# Patient Record
Sex: Male | Born: 1960 | Race: White | Hispanic: No | Marital: Married | Smoking: Never smoker
Health system: Southern US, Community
[De-identification: ages and names within clinical notes are randomized; demographics above are authoritative.]

## PROBLEM LIST (undated history)

## (undated) DIAGNOSIS — E079 Disorder of thyroid, unspecified: Secondary | ICD-10-CM

## (undated) DIAGNOSIS — F32A Depression, unspecified: Secondary | ICD-10-CM

## (undated) DIAGNOSIS — G939 Disorder of brain, unspecified: Secondary | ICD-10-CM

## (undated) DIAGNOSIS — I2510 Atherosclerotic heart disease of native coronary artery without angina pectoris: Secondary | ICD-10-CM

## (undated) DIAGNOSIS — F419 Anxiety disorder, unspecified: Secondary | ICD-10-CM

## (undated) DIAGNOSIS — K7460 Unspecified cirrhosis of liver: Secondary | ICD-10-CM

## (undated) DIAGNOSIS — F329 Major depressive disorder, single episode, unspecified: Secondary | ICD-10-CM

## (undated) DIAGNOSIS — I10 Essential (primary) hypertension: Secondary | ICD-10-CM

---

## 2009-06-29 ENCOUNTER — Emergency Department: Payer: Self-pay | Admitting: Internal Medicine

## 2014-03-09 ENCOUNTER — Ambulatory Visit: Payer: Self-pay

## 2014-12-24 ENCOUNTER — Ambulatory Visit
Admit: 2014-12-24 | Disposition: A | Discharge status: Home / Self Care | Payer: Self-pay | Attending: Family Medicine | Admitting: Family Medicine

## 2015-12-01 ENCOUNTER — Encounter: Payer: Self-pay | Admitting: Emergency Medicine

## 2015-12-01 ENCOUNTER — Emergency Department
Admission: EM | Admit: 2015-12-01 | Discharge: 2015-12-01 | Disposition: A | Discharge status: Home / Self Care | Payer: BLUE CROSS/BLUE SHIELD | Attending: Emergency Medicine | Admitting: Emergency Medicine

## 2015-12-01 DIAGNOSIS — F329 Major depressive disorder, single episode, unspecified: Secondary | ICD-10-CM | POA: Insufficient documentation

## 2015-12-01 DIAGNOSIS — I2510 Atherosclerotic heart disease of native coronary artery without angina pectoris: Secondary | ICD-10-CM | POA: Diagnosis not present

## 2015-12-01 DIAGNOSIS — K625 Hemorrhage of anus and rectum: Secondary | ICD-10-CM | POA: Diagnosis present

## 2015-12-01 DIAGNOSIS — L0231 Cutaneous abscess of buttock: Secondary | ICD-10-CM | POA: Insufficient documentation

## 2015-12-01 DIAGNOSIS — I10 Essential (primary) hypertension: Secondary | ICD-10-CM | POA: Diagnosis not present

## 2015-12-01 HISTORY — DX: Atherosclerotic heart disease of native coronary artery without angina pectoris: I25.10

## 2015-12-01 HISTORY — DX: Essential (primary) hypertension: I10

## 2015-12-01 HISTORY — DX: Major depressive disorder, single episode, unspecified: F32.9

## 2015-12-01 HISTORY — DX: Anxiety disorder, unspecified: F41.9

## 2015-12-01 HISTORY — DX: Depression, unspecified: F32.A

## 2015-12-01 MED ORDER — TRAMADOL HCL 50 MG PO TABS: 50.0000 mg | ORAL_TABLET | Freq: Four times a day (QID) | ORAL | Status: AC | PRN

## 2015-12-01 MED ORDER — SULFAMETHOXAZOLE-TRIMETHOPRIM 800-160 MG PO TABS: 1.0000 | ORAL_TABLET | Freq: Two times a day (BID) | ORAL | Status: DC

## 2015-12-01 NOTE — ED Provider Notes (Signed)
[facility] Emergency Department Provider Note  ____________________________________________    I have reviewed the triage vital signs and the nursing notes.   HISTORY  Chief Complaint Rectal Bleeding    HPI Gabriel Gray is a 55 y.o. male who presents with complaints of possible rectal bleeding and discharge since yesterday. Patient reports he fell onto his coccyx partially 1 week ago. He has had continued pain in his gluteal cleft. He noticed discharge yesterday and some blood. He denies lower show any weakness. No stool incontinence. No fevers or chills.     Past Medical History  Diagnosis Date  . Hypertension   . Depression   . Anxiety   . Coronary artery disease     There are no active problems to display for this patient.   History reviewed. No pertinent past surgical history.  Current Outpatient Rx  Name  Route  Sig  Dispense  Refill  . sulfamethoxazole-trimethoprim (BACTRIM DS,SEPTRA DS) 800-160 MG tablet   Oral   Take 1 tablet by mouth 2 (two) times daily.   14 tablet   0   . traMADol (ULTRAM) 50 MG tablet   Oral   Take 1 tablet (50 mg total) by mouth every 6 (six) hours as needed.   20 tablet   0     Allergies Review of patient's allergies indicates no known allergies.  History reviewed. No pertinent family history.  Social History Social History  Substance Use Topics  . Smoking status: Never Smoker   . Smokeless tobacco: None  . Alcohol Use: 1.2 - 1.8 oz/week    2-3 Glasses of wine per week     Comment: per night    Review of Systems  Constitutional: Negative for fever.  Gastrointestinal: Negative for abdominal pain  Genitourinary: Negative for dysuria. Musculoskeletal: Positive for coccyx pain Skin: Negative for rash. Neurological: Negative for focal weakness, negative for urinary or stool incontinence Psychiatric: no anxiety    ____________________________________________   PHYSICAL  EXAM:  VITAL SIGNS: ED Triage Vitals  Enc Vitals Group     BP 12/01/15 1337 169/87 mmHg     Pulse Rate 12/01/15 1337 100     Resp 12/01/15 1337 20     Temp 12/01/15 1337 98.6 F (37 C)     Temp Source 12/01/15 1337 Oral     SpO2 12/01/15 1337 97 %     Weight 12/01/15 1337 280 lb (127.007 kg)     Height 12/01/15 1337 6' (1.829 m)     Head Cir --      Peak Flow --      Pain Score 12/01/15 1355 6     Pain Loc --      Pain Edu? --      Excl. in [place]? --     Constitutional: Alert and oriented. Well appearing and in no distress.  Eyes: Conjunctivae are normal. No erythema or injection ENT   Head: Normocephalic and atraumatic.   Mouth/Throat: Mucous membranes are moist. Cardiovascular: Normal rate, regular rhythm. Normal and symmetric distal pulses are present in the upper extremities.  Respiratory: Normal respiratory effort without tachypnea nor retractions. Breath sounds are clear and equal bilaterally.  Gastrointestinal: Soft and non-tender in all quadrants. No distention. Patient with 4 x 2 cm gluteal cleft abscess which is draining pus and small amount of blood Genitourinary: deferred Musculoskeletal: Nontender with normal range of motion in all extremities. No lower extremity tenderness nor edema. Neurologic:  Normal speech and language. No  gross focal neurologic deficits are appreciated. Skin:  Skin is warm, dry and intact. No rash noted. Psychiatric: Mood and affect are normal. Patient exhibits appropriate insight and judgment.  ____________________________________________    LABS (pertinent positives/negatives)  Labs Reviewed - No data to display  ____________________________________________   EKG  None  ____________________________________________    RADIOLOGY  None  ____________________________________________   PROCEDURES  Procedure(s) performed: none  Critical Care performed: none  ____________________________________________   INITIAL  IMPRESSION / ASSESSMENT AND PLAN / ED COURSE  Pertinent labs & imaging results that were available during my care of the patient were reviewed by me and considered in my medical decision making (see chart for details).  Patient with draining gluteal cleft abscess, no fevers or chills. No evidence of surrounding cellulitis. We will start the patient on Bactrim and have him follow-up with his PCP for wound check in 2-3 days.  ____________________________________________   FINAL CLINICAL IMPRESSION(S) / ED DIAGNOSES  Final diagnoses:  Abscess, gluteal cleft          Lavonia Drafts, MD 12/01/15 1451

## 2015-12-01 NOTE — Discharge Instructions (Signed)

## 2015-12-01 NOTE — ED Notes (Signed)
MD Kinner at bedside  

## 2015-12-01 NOTE — ED Notes (Signed)
Pt verbalized understanding of discharge instructions. NAD at this time. 

## 2015-12-01 NOTE — ED Notes (Signed)
Pt presents to ED with c/o of rectal bleeding since yesterday. Pt said he fell 1 week ago on his tailbone and smelled something foul.

## 2017-08-30 DIAGNOSIS — F1021 Alcohol dependence, in remission: Secondary | ICD-10-CM | POA: Insufficient documentation

## 2017-09-17 DIAGNOSIS — E039 Hypothyroidism, unspecified: Secondary | ICD-10-CM | POA: Insufficient documentation

## 2017-09-18 DIAGNOSIS — K7030 Alcoholic cirrhosis of liver without ascites: Secondary | ICD-10-CM | POA: Insufficient documentation

## 2017-09-20 DIAGNOSIS — G119 Hereditary ataxia, unspecified: Secondary | ICD-10-CM | POA: Insufficient documentation

## 2017-12-31 ENCOUNTER — Encounter: Payer: Self-pay | Admitting: Emergency Medicine

## 2017-12-31 ENCOUNTER — Inpatient Hospital Stay
Admission: EM | Admit: 2017-12-31 | Discharge: 2018-01-05 | DRG: 641 | Disposition: A | Discharge status: Skilled Nursing Facility | Payer: BLUE CROSS/BLUE SHIELD | Attending: Internal Medicine | Admitting: Internal Medicine

## 2017-12-31 ENCOUNTER — Emergency Department: Payer: BLUE CROSS/BLUE SHIELD

## 2017-12-31 ENCOUNTER — Other Ambulatory Visit: Payer: Self-pay

## 2017-12-31 DIAGNOSIS — E875 Hyperkalemia: Secondary | ICD-10-CM | POA: Diagnosis present

## 2017-12-31 DIAGNOSIS — G119 Hereditary ataxia, unspecified: Secondary | ICD-10-CM | POA: Diagnosis present

## 2017-12-31 DIAGNOSIS — D696 Thrombocytopenia, unspecified: Secondary | ICD-10-CM | POA: Diagnosis present

## 2017-12-31 DIAGNOSIS — R531 Weakness: Secondary | ICD-10-CM | POA: Diagnosis present

## 2017-12-31 DIAGNOSIS — R188 Other ascites: Secondary | ICD-10-CM

## 2017-12-31 DIAGNOSIS — K766 Portal hypertension: Secondary | ICD-10-CM | POA: Diagnosis present

## 2017-12-31 DIAGNOSIS — Z79899 Other long term (current) drug therapy: Secondary | ICD-10-CM

## 2017-12-31 DIAGNOSIS — F329 Major depressive disorder, single episode, unspecified: Secondary | ICD-10-CM | POA: Diagnosis present

## 2017-12-31 DIAGNOSIS — K7290 Hepatic failure, unspecified without coma: Secondary | ICD-10-CM | POA: Diagnosis present

## 2017-12-31 DIAGNOSIS — F1020 Alcohol dependence, uncomplicated: Secondary | ICD-10-CM | POA: Diagnosis present

## 2017-12-31 DIAGNOSIS — Z79890 Hormone replacement therapy (postmenopausal): Secondary | ICD-10-CM | POA: Diagnosis not present

## 2017-12-31 DIAGNOSIS — Z8249 Family history of ischemic heart disease and other diseases of the circulatory system: Secondary | ICD-10-CM | POA: Diagnosis not present

## 2017-12-31 DIAGNOSIS — E860 Dehydration: Secondary | ICD-10-CM | POA: Diagnosis present

## 2017-12-31 DIAGNOSIS — R7989 Other specified abnormal findings of blood chemistry: Secondary | ICD-10-CM

## 2017-12-31 DIAGNOSIS — E039 Hypothyroidism, unspecified: Secondary | ICD-10-CM | POA: Diagnosis present

## 2017-12-31 DIAGNOSIS — I2510 Atherosclerotic heart disease of native coronary artery without angina pectoris: Secondary | ICD-10-CM | POA: Diagnosis present

## 2017-12-31 DIAGNOSIS — K7031 Alcoholic cirrhosis of liver with ascites: Secondary | ICD-10-CM | POA: Diagnosis not present

## 2017-12-31 DIAGNOSIS — R945 Abnormal results of liver function studies: Secondary | ICD-10-CM

## 2017-12-31 DIAGNOSIS — F419 Anxiety disorder, unspecified: Secondary | ICD-10-CM | POA: Diagnosis present

## 2017-12-31 DIAGNOSIS — Z7982 Long term (current) use of aspirin: Secondary | ICD-10-CM

## 2017-12-31 DIAGNOSIS — G312 Degeneration of nervous system due to alcohol: Secondary | ICD-10-CM | POA: Diagnosis present

## 2017-12-31 DIAGNOSIS — E878 Other disorders of electrolyte and fluid balance, not elsewhere classified: Secondary | ICD-10-CM | POA: Diagnosis present

## 2017-12-31 DIAGNOSIS — D72829 Elevated white blood cell count, unspecified: Secondary | ICD-10-CM | POA: Diagnosis present

## 2017-12-31 DIAGNOSIS — E871 Hypo-osmolality and hyponatremia: Principal | ICD-10-CM | POA: Diagnosis present

## 2017-12-31 DIAGNOSIS — I10 Essential (primary) hypertension: Secondary | ICD-10-CM | POA: Diagnosis present

## 2017-12-31 DIAGNOSIS — E8770 Fluid overload, unspecified: Secondary | ICD-10-CM | POA: Diagnosis present

## 2017-12-31 DIAGNOSIS — R000 Tachycardia, unspecified: Secondary | ICD-10-CM | POA: Diagnosis present

## 2017-12-31 LAB — COMPREHENSIVE METABOLIC PANEL
ALBUMIN: 3 g/dL — AB (ref 3.5–5.0)
ALK PHOS: 287 U/L — AB (ref 38–126)
ALT: 62 U/L (ref 17–63)
AST: 140 U/L — ABNORMAL HIGH (ref 15–41)
Anion gap: 11 (ref 5–15)
BILIRUBIN TOTAL: 4.7 mg/dL — AB (ref 0.3–1.2)
BUN: 7 mg/dL (ref 6–20)
CALCIUM: 8.5 mg/dL — AB (ref 8.9–10.3)
CO2: 25 mmol/L (ref 22–32)
Chloride: 79 mmol/L — ABNORMAL LOW (ref 101–111)
Creatinine, Ser: 0.74 mg/dL (ref 0.61–1.24)
GFR calc non Af Amer: >60 mL/min (ref >60)
GLUCOSE: 127 mg/dL — AB (ref 65–99)
POTASSIUM: 5.2 mmol/L — AB (ref 3.5–5.1)
SODIUM: 115 mmol/L — AB (ref 135–145)
TOTAL PROTEIN: 6.3 g/dL — AB (ref 6.5–8.1)

## 2017-12-31 LAB — URINALYSIS, COMPLETE (UACMP) WITH MICROSCOPIC
Bilirubin Urine: NEGATIVE
Glucose, UA: NEGATIVE mg/dL
HGB URINE DIPSTICK: NEGATIVE
Ketones, ur: NEGATIVE mg/dL
Leukocytes, UA: NEGATIVE
NITRITE: NEGATIVE
PH: 5 (ref 5.0–8.0)
Protein, ur: NEGATIVE mg/dL
Specific Gravity, Urine: 1.015 (ref 1.005–1.030)

## 2017-12-31 LAB — IRON AND TIBC: Iron: 96 ug/dL (ref 45–182)

## 2017-12-31 LAB — MAGNESIUM: MAGNESIUM: 1.5 mg/dL — AB (ref 1.7–2.4)

## 2017-12-31 LAB — TROPONIN I

## 2017-12-31 LAB — CBC
HEMATOCRIT: 41.7 % (ref 40.0–52.0)
Hemoglobin: 14.4 g/dL (ref 13.0–18.0)
MCH: 35 pg — ABNORMAL HIGH (ref 26.0–34.0)
MCHC: 34.6 g/dL (ref 32.0–36.0)
MCV: 101 fL — AB (ref 80.0–100.0)
PLATELETS: 364 10*3/uL (ref 150–440)
RBC: 4.13 MIL/uL — ABNORMAL LOW (ref 4.40–5.90)
RDW: 13.5 % (ref 11.5–14.5)
WBC: 21 10*3/uL — AB (ref 3.8–10.6)

## 2017-12-31 LAB — FOLATE: Folate: 6.4 ng/mL (ref >5.9)

## 2017-12-31 LAB — PROTIME-INR
INR: 1.2
Prothrombin Time: 15.1 seconds (ref 11.4–15.2)

## 2017-12-31 LAB — VITAMIN B12: Vitamin B-12: 1205 pg/mL — ABNORMAL HIGH (ref 180–914)

## 2017-12-31 LAB — AMMONIA: Ammonia: 17 umol/L (ref 9–35)

## 2017-12-31 LAB — FERRITIN: FERRITIN: 1359 ng/mL — AB (ref 24–336)

## 2017-12-31 LAB — LACTIC ACID, PLASMA: LACTIC ACID, VENOUS: 1.9 mmol/L (ref 0.5–1.9)

## 2017-12-31 LAB — ETHANOL: Alcohol, Ethyl (B): <10 mg/dL (ref <10)

## 2017-12-31 MED ORDER — BUPROPION HCL ER (XL) 300 MG PO TB24
300.0000 mg | ORAL_TABLET | Freq: Every day | ORAL | Status: DC
  Administered 2017-12-31 – 2018-01-05 (×5): 300 mg via ORAL
  Filled 2017-12-31 (×6): qty 1

## 2017-12-31 MED ORDER — ACETAMINOPHEN 325 MG PO TABS
650.0000 mg | ORAL_TABLET | Freq: Four times a day (QID) | ORAL | Status: DC | PRN
  Administered 2018-01-02 – 2018-01-04 (×3): 650 mg via ORAL
  Filled 2017-12-31 (×3): qty 2

## 2017-12-31 MED ORDER — BISACODYL 5 MG PO TBEC
5.0000 mg | DELAYED_RELEASE_TABLET | Freq: Every day | ORAL | Status: DC | PRN
  Administered 2018-01-03: 08:00:00 5 mg via ORAL
  Filled 2017-12-31: qty 1

## 2017-12-31 MED ORDER — SODIUM CHLORIDE 0.9 % IV BOLUS
1000.0000 mL | Freq: Once | INTRAVENOUS | Status: AC
  Administered 2017-12-31: 1000 mL via INTRAVENOUS

## 2017-12-31 MED ORDER — ASPIRIN EC 81 MG PO TBEC
81.0000 mg | DELAYED_RELEASE_TABLET | Freq: Every day | ORAL | Status: DC
  Administered 2017-12-31 – 2018-01-05 (×6): 81 mg via ORAL
  Filled 2017-12-31 (×6): qty 1

## 2017-12-31 MED ORDER — ONDANSETRON 4 MG PO TBDP
4.0000 mg | ORAL_TABLET | Freq: Once | ORAL | Status: AC
  Administered 2017-12-31: 4 mg via ORAL

## 2017-12-31 MED ORDER — MAGNESIUM SULFATE 2 GM/50ML IV SOLN
2.0000 g | Freq: Once | INTRAVENOUS | Status: AC
  Administered 2017-12-31: 2 g via INTRAVENOUS
  Filled 2017-12-31: qty 50

## 2017-12-31 MED ORDER — SENNOSIDES-DOCUSATE SODIUM 8.6-50 MG PO TABS
1.0000 | ORAL_TABLET | Freq: Every evening | ORAL | Status: DC | PRN
  Administered 2018-01-03: 1 via ORAL
  Filled 2017-12-31: qty 1

## 2017-12-31 MED ORDER — HYDROCODONE-ACETAMINOPHEN 5-325 MG PO TABS: 1.0000 | ORAL_TABLET | ORAL | Status: DC | PRN

## 2017-12-31 MED ORDER — LORAZEPAM 2 MG/ML IJ SOLN: 1.0000 mg | Freq: Four times a day (QID) | INTRAMUSCULAR | Status: AC | PRN

## 2017-12-31 MED ORDER — ALBUTEROL SULFATE (2.5 MG/3ML) 0.083% IN NEBU: 2.5000 mg | INHALATION_SOLUTION | RESPIRATORY_TRACT | Status: DC | PRN

## 2017-12-31 MED ORDER — ONDANSETRON HCL 4 MG/2ML IJ SOLN
4.0000 mg | Freq: Four times a day (QID) | INTRAMUSCULAR | Status: DC | PRN
  Administered 2017-12-31 – 2018-01-02 (×7): 4 mg via INTRAVENOUS
  Filled 2017-12-31 (×7): qty 2

## 2017-12-31 MED ORDER — FOLIC ACID 1 MG PO TABS
1.0000 mg | ORAL_TABLET | Freq: Every day | ORAL | Status: DC
  Administered 2017-12-31 – 2018-01-05 (×6): 1 mg via ORAL
  Filled 2017-12-31 (×6): qty 1

## 2017-12-31 MED ORDER — LEVOTHYROXINE SODIUM 50 MCG PO TABS
50.0000 ug | ORAL_TABLET | Freq: Every day | ORAL | Status: DC
Start: 2018-01-01 — End: 2018-01-05
  Administered 2018-01-01 – 2018-01-05 (×5): 50 ug via ORAL
  Filled 2017-12-31 (×5): qty 1

## 2017-12-31 MED ORDER — SODIUM CHLORIDE 0.9 % IV SOLN
INTRAVENOUS | Status: DC
  Administered 2017-12-31: 1000 mL via INTRAVENOUS
  Administered 2018-01-01 (×2): via INTRAVENOUS

## 2017-12-31 MED ORDER — FUROSEMIDE 10 MG/ML IJ SOLN
20.0000 mg | Freq: Once | INTRAMUSCULAR | Status: AC
  Administered 2017-12-31: 20 mg via INTRAVENOUS
  Filled 2017-12-31: qty 2

## 2017-12-31 MED ORDER — ACETAMINOPHEN 650 MG RE SUPP: 650.0000 mg | Freq: Four times a day (QID) | RECTAL | Status: DC | PRN

## 2017-12-31 MED ORDER — ONDANSETRON HCL 4 MG PO TABS: 4.0000 mg | ORAL_TABLET | Freq: Once | ORAL | Status: DC

## 2017-12-31 MED ORDER — TRAZODONE HCL 50 MG PO TABS
100.0000 mg | ORAL_TABLET | Freq: Every day | ORAL | Status: DC
  Administered 2018-01-02 – 2018-01-04 (×4): 100 mg via ORAL
  Filled 2017-12-31 (×6): qty 2

## 2017-12-31 MED ORDER — ADULT MULTIVITAMIN W/MINERALS CH
1.0000 | ORAL_TABLET | Freq: Every day | ORAL | Status: DC
  Administered 2018-01-01 – 2018-01-05 (×5): 1 via ORAL
  Filled 2017-12-31 (×5): qty 1

## 2017-12-31 MED ORDER — VITAMIN B-1 100 MG PO TABS
100.0000 mg | ORAL_TABLET | Freq: Every day | ORAL | Status: DC
  Administered 2018-01-01 – 2018-01-05 (×5): 100 mg via ORAL
  Filled 2017-12-31 (×6): qty 1

## 2017-12-31 MED ORDER — LORAZEPAM 1 MG PO TABS: 1.0000 mg | ORAL_TABLET | Freq: Four times a day (QID) | ORAL | Status: AC | PRN

## 2017-12-31 MED ORDER — ONDANSETRON 4 MG PO TBDP
ORAL_TABLET | ORAL | Status: AC
  Administered 2017-12-31: 4 mg via ORAL
  Filled 2017-12-31: qty 1

## 2017-12-31 MED ORDER — THIAMINE HCL 100 MG/ML IJ SOLN
100.0000 mg | Freq: Every day | INTRAMUSCULAR | Status: DC
  Filled 2017-12-31 (×6): qty 1

## 2017-12-31 MED ORDER — ONDANSETRON HCL 4 MG PO TABS
4.0000 mg | ORAL_TABLET | Freq: Four times a day (QID) | ORAL | Status: DC | PRN
  Administered 2018-01-05: 4 mg via ORAL
  Filled 2017-12-31 (×2): qty 1

## 2017-12-31 MED ORDER — HEPARIN SODIUM (PORCINE) 5000 UNIT/ML IJ SOLN
5000.0000 [IU] | Freq: Three times a day (TID) | INTRAMUSCULAR | Status: DC
  Administered 2017-12-31 (×2): 5000 [IU] via SUBCUTANEOUS
  Filled 2017-12-31 (×2): qty 1

## 2017-12-31 NOTE — ED Notes (Signed)
Dr. Chen at bedside.

## 2017-12-31 NOTE — Progress Notes (Signed)
This RN informed by EMS who brought patient in stated that the patient's living situation is less than ideal, involving item hoarding and animal hoarding.  Reported numerous trash bags in the garage filled with wine bottles.  Also reports of a lot of cats and feline waste in the home.  Patient found down in own feces.  Patient's wife had what sounds like a very flat affect.  EMS worried about patient (or anyone) living in that situation.  Will discuss with charge next steps for APS reporting.

## 2017-12-31 NOTE — ED Triage Notes (Signed)
Pt to ED via EMS from home with witnessed syncopal episodes at home. PT states has been going on x1year. PT unaware of LOC. PT appears pale, per EMS pt states he has " liver issues" but denies cirrhosis. MD at bedside . VSS

## 2017-12-31 NOTE — ED Notes (Signed)
Critical sodium 115 verbal in person Dr. Marjean Donna.

## 2017-12-31 NOTE — ED Notes (Signed)
Bile on patient socks, new socks given wife at bedside.

## 2017-12-31 NOTE — ED Notes (Signed)
Pt oxygen 100% on RA, pt displays no problems or increased WOB but placed on 2L [place] per request.

## 2017-12-31 NOTE — H&P (Addendum)
[place] [facility] [place] NAME: Gabriel Gray    MR#:  [id number]  DATE OF BIRTH:  [birth year]  DATE OF ADMISSION:  12/31/2017  PRIMARY CARE PHYSICIAN: Gennaro Africa, MD   REQUESTING/REFERRING PHYSICIAN: Dr. Owens Shark.  CHIEF COMPLAINT:   Chief Complaint  Patient presents with  . Near Syncope   Generalized weakness, fall and near syncope. HISTORY OF PRESENT ILLNESS:  Gabriel Gray  is a 57 y.o. male with a known history of CAD, hypertension, depression and anxiety.  The patient presented the ED with above chief complaints.  The patient is alert and awake.  He denies any syncope episode but stated that he feels dizzy, weak and almost passed out.  The patient denies any loss of consciousness.  He complains of diarrhea for about 1-2 weeks.  He denies any fever, chills or abdominal pain, or taking constipation medication.  He is found tachycardia and hyponatremia with sodium level at 115.  PAST MEDICAL HISTORY:   Past Medical History:  Diagnosis Date  . Anxiety   . Coronary artery disease   . Depression   . Hypertension     PAST SURGICAL HISTORY:  History reviewed. No pertinent surgical history.  SOCIAL HISTORY:   Social History   Tobacco Use  . Smoking status: Never Smoker  Substance Use Topics  . Alcohol use: Yes    Alcohol/week: 1.2 - 1.8 oz    Types: 2 - 3 Glasses of wine per week    Comment: per night    FAMILY HISTORY:   Family History  Problem Relation Age of Onset  . Hypertension Mother   . Hypertension Father   . Prostate cancer Father     DRUG ALLERGIES:  No Known Allergies  REVIEW OF SYSTEMS:   Review of Systems  Constitutional: Positive for malaise/fatigue. Negative for chills and fever.  HENT: Negative for sore throat.   Eyes: Negative for blurred vision and double vision.  Respiratory: Negative for cough, hemoptysis, shortness of breath, wheezing and stridor.   Cardiovascular: Negative for chest pain,  palpitations, orthopnea and leg swelling.  Gastrointestinal: Positive for diarrhea and nausea. Negative for abdominal pain, blood in stool, melena and vomiting.  Genitourinary: Negative for dysuria, flank pain and hematuria.  Musculoskeletal: Negative for back pain and joint pain.  Skin: Negative for rash.  Neurological: Positive for dizziness and weakness. Negative for sensory change, focal weakness, seizures, loss of consciousness and headaches.       Leg cramps.  Endo/Heme/Allergies: Negative for polydipsia.  Psychiatric/Behavioral: Negative for depression. The patient is nervous/anxious.     MEDICATIONS AT HOME:   Prior to Admission medications   Medication Sig Start Date End Date Taking? Authorizing Provider  aspirin (ECOTRIN LOW STRENGTH) 81 MG EC tablet Take 1 tablet by mouth daily. 12/21/08  Yes [provider]  B Complex Vitamins (VITAMIN B COMPLEX PO) Take 1 tablet by mouth daily. 08/30/17 08/30/18 Yes [provider]  buPROPion (WELLBUTRIN XL) 300 MG 24 hr tablet Take 1 tablet by mouth daily. 08/25/17  Yes [provider]  levothyroxine (SYNTHROID, LEVOTHROID) 50 MCG tablet Take 1 tablet by mouth daily. 08/30/17 08/30/18 Yes [provider]  ondansetron (ZOFRAN-ODT) 8 MG disintegrating tablet Take 1 tablet by mouth 3 (three) times daily as needed. 12/29/17  Yes [provider]  traZODone (DESYREL) 50 MG tablet Take 100 mg by mouth at bedtime. 12/28/17  Yes [provider]      VITAL SIGNS:  Blood pressure 117/90, pulse (!) 110, temperature 97.7 F (36.5 C), resp. rate 19, SpO2 99 %.  PHYSICAL EXAMINATION:  Physical Exam  GENERAL:  57 y.o.-year-old patient lying in the bed with no acute distress.  EYES: Pupils equal, round, reactive to light and accommodation. No scleral icterus. Extraocular muscles intact.  HEENT: Head atraumatic, normocephalic. Oropharynx and nasopharynx clear.  NECK:  Supple, no jugular venous  distention. No thyroid enlargement, no tenderness.  LUNGS: Normal breath sounds bilaterally, no wheezing, rales,rhonchi or crepitation. No use of accessory muscles of respiration.  CARDIOVASCULAR: S1, S2 normal. No murmurs, rubs, or gallops.  ABDOMEN: Soft, mild tenderness and distention.  No rigidity or rebound.  Bowel sounds present.  Unable to estimate organomegaly or mass due to distention with gas.  EXTREMITIES: No cyanosis, or clubbing.  Bilateral leg pitting edema. NEUROLOGIC: Cranial nerves II through XII are intact. Muscle strength 4/5 in all extremities. Sensation intact. Gait not checked.  PSYCHIATRIC: The patient is alert and oriented x 3.  SKIN: No obvious rash, lesion, or ulcer.   LABORATORY PANEL:   CBC Recent Labs  Lab 12/31/17 1127  WBC 21.0*  HGB 14.4  HCT 41.7  PLT 364   ------------------------------------------------------------------------------------------------------------------  Chemistries  Recent Labs  Lab 12/31/17 1127  NA 115*  K 5.2*  CL 79*  CO2 25  GLUCOSE 127*  BUN 7  CREATININE 0.74  CALCIUM 8.5*  AST 140*  ALT 62  ALKPHOS 287*  BILITOT 4.7*   ------------------------------------------------------------------------------------------------------------------  Cardiac Enzymes Recent Labs  Lab 12/31/17 1127  TROPONINI <0.03   ------------------------------------------------------------------------------------------------------------------  RADIOLOGY:  Dg Chest Portable 1 View  Result Date: 12/31/2017 CLINICAL DATA:  Dyspnea syncope EXAM: PORTABLE CHEST 1 VIEW COMPARISON:  01/03/2015 FINDINGS: Hypoventilation. Decreased lung volume with bibasilar atelectasis. Negative for heart failure or effusion IMPRESSION: Hypoventilation with bibasilar atelectasis. Electronically Signed   By: Franchot Gallo M.D.   On: 12/31/2017 13:13      IMPRESSION AND PLAN:   Severe hyponatremia. The patient will be admitted to medical floor. Fluid  restriction, start normal saline IV and follow-up sodium level.  Abnormal liver function test.  Follow-up LFT and abdominal ultrasound, GI consult.  Mild hyperkalemia.  IV normal saline in the follow-up BMP.  Leukocytosis.  Unclear etiology, chest x-ray is unremarkable, urinalysis is unremarkable.  Possible related to diarrhea.  Diarrhea.  Follow-up GI panel and C. difficile test.  IV fluids support.  Alcohol abuse.  Start CIWA protocol.  CAD.  Continue aspirin.  Generalized weakness.  PT evaluation when the patient is a stable.  All the records are reviewed and case discussed with ED provider. Management plans discussed with the patient, family and they are in agreement.  CODE STATUS: Full code.  TOTAL TIME TAKING CARE OF THIS PATIENT: 56 minutes.    Demetrios Loll M.D on 12/31/2017 at 3:16 PM  Between 7am to 6pm - Pager - [phone]  After 6pm go to [email or web address] - Proofreader  [employer] [facility]  Office  [phone]  CC: Primary care physician; Gennaro Africa, MD   Note: This dictation was prepared with Dragon dictation along with smaller phrase technology. Any transcriptional errors that result from this process are unin

## 2017-12-31 NOTE — ED Notes (Signed)
Report to Shannon, RN  

## 2017-12-31 NOTE — ED Notes (Signed)
Pt repositioned in bed, bandage on left knee changed.

## 2017-12-31 NOTE — ED Notes (Addendum)
Attempted to get patient to bedside toilet. Pt uncooperative with moving extremities, states that he is is too weak and cannot even lift up his arms. States he does not have to use restroom to give urine or stool sample. Requesting nausea medication that dissolves under tongue.

## 2017-12-31 NOTE — ED Provider Notes (Signed)
[facility] Emergency Department Provider Note    First MD Initiated Contact with Patient 12/31/17 1306     (approximate)  I have reviewed the triage vital signs and the nursing notes.  level V caveat: History physical exam limited secondary to altered mental status HISTORY  Chief Complaint Near Syncope    HPI Gabriel Gray is a 57 y.o. male with below list of chronic medical conditions presents to the emergency department following multiple in his syncopal episodes or EMS. Patient states that he has had repetitive syncopal episodes times one year without any clear known etiology. Patient is unaware of any loss of consciousness. However EMS states that while in route to the emergency department the patient briefly became unresponsive with rapid return to previous mentation. Patient however confused on arrival to the emergency department.patient states "liver issues however they don't know why".patient also admits to a history of chronic diarrhea without a known etiology   Past Medical History:  Diagnosis Date  . Anxiety   . Coronary artery disease   . Depression   . Hypertension     Patient Active Problem List   Diagnosis Date Noted  . Hyponatremia 12/31/2017    History reviewed. No pertinent surgical history.  Prior to Admission medications   Medication Sig Start Date End Date Taking? Authorizing Provider  ondansetron (ZOFRAN-ODT) 8 MG disintegrating tablet Take 1 tablet by mouth 3 (three) times daily as needed. 12/29/17  Yes [provider]  traZODone (DESYREL) 50 MG tablet Take 100 mg by mouth at bedtime. 12/28/17  Yes [provider]    Allergies no known drug allergies No family history on file.  Social History Social History   Tobacco Use  . Smoking status: Never Smoker  Substance Use Topics  . Alcohol use: Yes    Alcohol/week: 1.2 - 1.8 oz    Types: 2 - 3 Glasses of wine per week    Comment: per night  . Drug  use: No    Review of Systems Constitutional: No fever/chills Eyes: No visual changes. ENT: No sore throat. Cardiovascular: Denies chest pain. Respiratory: Denies shortness of breath. Gastrointestinal: No abdominal pain.  No nausea, no vomiting.  No diarrhea.  No constipation. Genitourinary: Negative for dysuria. Musculoskeletal: Negative for neck pain.  Negative for back pain. Integumentary: Negative for rash. Neurological: Negative for headaches, focal weakness or numbness.positive for confusion   ____________________________________________   PHYSICAL EXAM:  VITAL SIGNS: ED Triage Vitals  Enc Vitals Group     BP 12/31/17 1119 102/82     Pulse Rate 12/31/17 1119 72     Resp 12/31/17 1117 16     Temp 12/31/17 1120 97.7 F (36.5 C)     Temp Source 12/31/17 1117 Oral     SpO2 12/31/17 1117 99 %     Weight --      Height --      Head Circumference --      Peak Flow --      Pain Score 12/31/17 1118 0     Pain Loc --      Pain Edu? --      Excl. in [place]? --     Constitutional: Alert and oriented.however rambling nonsensical speech Eyes: Conjunctivae are normal. scleral icterus PERRL. EOMI. Head: Atraumatic. Mouth/Throat: Mucous membranes are moist.  Oropharynx non-erythematous. Neck: No stridor.   Cardiovascular: Normal rate, regular rhythm. Good peripheral circulation. Grossly normal heart sounds. Respiratory: Normal respiratory effort.  No retractions. Lungs  CTAB. Gastrointestinal: Soft and nontender. No distention.  Musculoskeletal: No lower extremity tenderness nor edema. No gross deformities of extremities. Neurologic:  rambling nonsensical speech, confusion, occasional slurring of speech  Skin:  Skin is warm, dry and intact. No rash noted.positive jaundice Psychiatric: Mood and affect are normal. Speech and behavior are normal. ____________________________________________   LABS (all labs ordered are listed, but only abnormal results are displayed)  Labs  Reviewed  CBC - Abnormal; Notable for the following components:      Result Value   WBC 21.0 (*)    RBC 4.13 (*)    MCV 101.0 (*)    MCH 35.0 (*)    All other components within normal limits  COMPREHENSIVE METABOLIC PANEL - Abnormal; Notable for the following components:   Sodium 115 (*)    Potassium 5.2 (*)    Chloride 79 (*)    Glucose, Bld 127 (*)    Calcium 8.5 (*)    Total Protein 6.3 (*)    Albumin 3.0 (*)    AST 140 (*)    Alkaline Phosphatase 287 (*)    Total Bilirubin 4.7 (*)    All other components within normal limits  GASTROINTESTINAL PANEL BY PCR, STOOL (REPLACES STOOL CULTURE)  AMMONIA  TROPONIN I  PROTIME-INR  LACTIC ACID, PLASMA  URINALYSIS, COMPLETE (UACMP) WITH MICROSCOPIC   ____________________________________________  EKG  ED ECG REPORT I, Leon N BROWN, the attending physician, personally viewed and interpreted this ECG.   Date: 12/31/2017  EKG Time: 11:18 AM  Rate: 106  Rhythm: sinus tachycardia  Axis: normal  Intervals:normal  ST&T Change: none  ____________________________________________  RADIOLOGY I, [place] N BROWN, personally viewed and evaluated these images (plain radiographs) as part of my medical decision making, as well as reviewing the written report by the radiologist.  ED MD interpretation:  no acute pulmonary findings per radiologist  Official radiology report(s): Dg Chest Portable 1 View  Result Date: 12/31/2017 CLINICAL DATA:  Dyspnea syncope EXAM: PORTABLE CHEST 1 VIEW COMPARISON:  01/03/2015 FINDINGS: Hypoventilation. Decreased lung volume with bibasilar atelectasis. Negative for heart failure or effusion IMPRESSION: Hypoventilation with bibasilar atelectasis. Electronically Signed   By: Franchot Gallo M.D.   On: 12/31/2017 13:13    ____________________________________________     .Critical Care Performed by: Gregor Hams, MD Authorized by: Gregor Hams, MD   Critical care provider statement:     Critical care time (minutes):  30   Critical care time was exclusive of:  Separately billable procedures and treating other patients and teaching time   Critical care was necessary to treat or prevent imminent or life-threatening deterioration of the following conditions:  CNS failure or compromise   Critical care was time spent personally by me on the following activities:  Development of treatment plan with patient or surrogate, discussions with consultants, evaluation of patient's response to treatment, examination of patient, obtaining history from patient or surrogate, ordering and performing treatments and interventions, ordering and review of laboratory studies, ordering and review of radiographic studies, pulse oximetry, re-evaluation of patient's condition and review of old charts   I assumed direction of critical care for this patient from another provider in my specialty: no       ____________________________________________   INITIAL IMPRESSION / ASSESSMENT AND PLAN / ED COURSE  As part of my medical decision making, I reviewed the following data within the electronic MEDICAL RECORD NUMBER  57 year old male presenting with above stated history of physical exam secondary to altered mental  status and syncopal episodes. Concern for possible intracranial abnormality including CVA. Also concern for possible metabolic abnormality including hyperammonemia and hyponatremiagiven history of "liver issues and chronic diarrhea. Laboratory data consistent with hyponatremia with a sodium of 1:15 and a such patient received 2 L IV normal saline while in the emergency department. Ammonia normal. Patient discussed with Dr. Bridgett Larsson for hospital admission for further evaluation and management of hyponatremia ____________________________________________  FINAL CLINICAL IMPRESSION(S) / ED DIAGNOSES  Final diagnoses:  Hyponatremia     MEDICATIONS GIVEN DURING THIS VISIT:  Medications  0.9 %  sodium  chloride infusion (1,000 mLs Intravenous New Bag/Given 12/31/17 1323)  ondansetron (ZOFRAN-ODT) disintegrating tablet 4 mg (4 mg Oral Given 12/31/17 1220)  sodium chloride 0.9 % bolus 1,000 mL (0 mLs Intravenous Stopped 12/31/17 1319)     ED Discharge Orders    None       Note:  This document was prepared using Dragon voice recognition software and may include unintentional dictation errors.    Gregor Hams, MD 12/31/17 1357

## 2017-12-31 NOTE — ED Notes (Signed)
ED Provider at bedside. 

## 2017-12-31 NOTE — Consult Note (Signed)
Gabriel Darby, MD [address]  [place]  [place], [place] [address]  Main: [phone]  Fax: [phone] Pager: [phone]   Consultation  Referring Provider:     No ref. provider found Primary Care Physician:  Gennaro Africa, MD Primary Gastroenterologist: None        Reason for Consultation:     Decompensated cirrhosis  Date of Admission:  12/31/2017 Date of Consultation:  12/31/2017         HPI:   Gabriel Gray is a 57 y.o.caucasian male with history of decompensated cirrhosis, secondary to alcohol use presents with severe hyponatremia and worsening of abdominal distention. He was found down at home and brought by EMS to the emergency department. According to physical therapy, patient has been having several episodes of syncope over  1 year. He tells me that he does not know if he had cirrhosis. He was told that he has liver damage from alcohol use. 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 He is found to have severe hyponatremia, serum sodium 115. He is diagnosed with cirrhosis based on CT in 09/2017 with also showed mild ascites. He has chronically abnormal LFTs since 2017. Patient reports that beginning alcohol heavily for 7-8 years, lately has been consuming alcohol during weekends. He lives at home with his wife. He also reports having nonbloody diarrhea. He denies hematemesis, melena, rectal bleeding, nausea, vomiting, fever, chills. He is unemployed.    NSAIDs: denies  Antiplts/Anticoagulants/Anti thrombotics: none  GI Procedures: none  Past Medical History:  Diagnosis Date  . Anxiety   . Coronary artery disease   . Depression   . Hypertension     History reviewed. No pertinent surgical history.  Prior to Admission medications   Medication Sig Start Date End Date Taking? Authorizing Provider  aspirin (ECOTRIN LOW STRENGTH) 81 MG EC  tablet Take 1 tablet by mouth daily. 12/21/08  Yes [provider]  B Complex Vitamins (VITAMIN B COMPLEX PO) Take 1 tablet by mouth daily. 08/30/17 08/30/18 Yes [provider]  buPROPion (WELLBUTRIN XL) 300 MG 24 hr tablet Take 1 tablet by mouth daily. 08/25/17  Yes [provider]  levothyroxine (SYNTHROID, LEVOTHROID) 50 MCG tablet Take 1 tablet by mouth daily. 08/30/17 08/30/18 Yes [provider]  ondansetron (ZOFRAN-ODT) 8 MG disintegrating tablet Take 1 tablet by mouth 3 (three) times daily as needed. 12/29/17  Yes [provider]  traZODone (DESYREL) 50 MG tablet Take 100 mg by mouth at bedtime. 12/28/17  Yes [provider]    Family History  Problem Relation Age of Onset  . Hypertension Mother   . Hypertension Father   . Prostate cancer Father      Social History   Tobacco Use  . Smoking status: Never Smoker  . Smokeless tobacco: Never Used  Substance Use Topics  . Alcohol use: Yes    Alcohol/week: 1.2 - 1.8 oz    Types: 2 - 3 Glasses of wine per week    Comment: per night  . Drug use: No    Allergies as of 12/31/2017  . (No Known Allergies)    Review of Systems:    All systems reviewed and negative except where noted in HPI.   Physical Exam:  Vital signs in last 24 hours: Temp:  [97.7 F (36.5 C)-99.6 F (37.6 C)] 99.6 F (37.6 C) (04/19  1741) Pulse Rate:  [72-111] 107 (04/19 1741) Resp:  [14-22] 20 (04/19 1741) BP: (102-126)/(74-90) 126/82 (04/19 1741) SpO2:  [97 %-100 %] 97 % (04/19 1741) Weight:  [218 lb 14.4 oz (99.3 kg)] 218 lb 14.4 oz (99.3 kg) (04/19 1741)   General:  cooperative in NAD, appears confused Head:  Normocephalic and atraumatic. Eyes:   No icterus.   Conjunctiva pink. PERRLA. Ears:  Normal auditory acuity. Neck:  Supple; no masses or thyroidomegaly Lungs: Respirations even and unlabored. Lungs clear to auscultation bilaterally.   No wheezes, crackles, or rhonchi.  Heart:  Regular rate  and rhythm;  Without murmur, clicks, rubs or gallops Abdomen:  Soft, diffusely distended, nontender. Normal bowel sounds. No appreciable masses or hepatomegaly.  No rebound or guarding.  Rectal:  Not performed. Msk:  Symmetrical without gross deformities.generalized weakness, significant muscle wasting in bilateral upper and lower extremities  Extremities:  2+ edema, cyanosis or clubbing. Neurologic:  Alert and oriented x3; Skin: livedo reticularis  LAB RESULTS: CBC Latest Ref Rng & Units 12/31/2017  WBC 3.8 - 10.6 K/uL 21.0(H)  Hemoglobin 13.0 - 18.0 g/dL 14.4  Hematocrit 40.0 - 52.0 % 41.7  Platelets 150 - 440 K/uL 364    BMET BMP Latest Ref Rng & Units 12/31/2017  Glucose 65 - 99 mg/dL 127(H)  BUN 6 - 20 mg/dL 7  Creatinine 0.61 - 1.24 mg/dL 0.74  Sodium 135 - 145 mmol/L 115(LL)  Potassium 3.5 - 5.1 mmol/L 5.2(H)  Chloride 101 - 111 mmol/L 79(L)  CO2 22 - 32 mmol/L 25  Calcium 8.9 - 10.3 mg/dL 8.5(L)    LFT Hepatic Function Latest Ref Rng & Units 12/31/2017  Total Protein 6.5 - 8.1 g/dL 6.3(L)  Albumin 3.5 - 5.0 g/dL 3.0(L)  AST 15 - 41 U/L 140(H)  ALT 17 - 63 U/L 62  Alk Phosphatase 38 - 126 U/L 287(H)  Total Bilirubin 0.3 - 1.2 mg/dL 4.7(H)     STUDIES: Dg Chest Portable 1 View  Result Date: 12/31/2017 CLINICAL DATA:  Dyspnea syncope EXAM: PORTABLE CHEST 1 VIEW COMPARISON:  01/03/2015 FINDINGS: Hypoventilation. Decreased lung volume with bibasilar atelectasis. Negative for heart failure or effusion IMPRESSION: Hypoventilation with bibasilar atelectasis. Electronically Signed   By: Franchot Gallo M.D.   On: 12/31/2017 13:13      Impression / Plan:   Gabriel Gray is a 57 y.o. Caucasian male with alcohol induced decompensated cirrhosis presents with altered mental status in the setting of severe hyponatremia, ascites   Hyponatremia: most likely hypervolemic hyponatremia with fluid retention and secondary to decompensated cirrhosis - Management per primary team    - recommend nephrology consult - receiving normal saline - recommend furosemide 20 mg IV 1 - Monitor serum sodium closely  Decompensated cirrhosis: secondary to alcohol use,child class C, MELD-Na 24 Does not have chronic hepatitis B or C Portal hypertension: manifested by thrombocytopenia, ascites Ascites/volume overload: recommend ultrasound guided therapeutic paracentesis and fluid analysis Diuretics and 2 g sodium diet after resolution of hyponatremia Recommend evaluation for TIPS as outpatient if patient does not tolerate diuretics secondary to hyponatremia and needing serial paracentesis Varices: unknown, recommend EGD as outpatient [place] screening: CT from 09/2017 did not reveal liver lesions, check serum AFP Abstinence from alcohol  Alcoholism: He may have developed Wernicke's encephalopathy and cerebellar ataxia Check thiamine levels, B12 and folate levels Continue to administer folate acid, thiamine and multivitamin daily Abstinence from alcohol   Thank you for involving me in the care of this patient.  LOS: 0 days   Sherri Sear, MD  12/31/2017, 8:03 PM   Note: This dictation was prepared with Dragon dictation along with smaller phrase technology. Any transcriptional errors that result from this process are unintentional.

## 2018-01-01 ENCOUNTER — Inpatient Hospital Stay: Payer: BLUE CROSS/BLUE SHIELD

## 2018-01-01 LAB — HEPATIC FUNCTION PANEL
ALK PHOS: 208 U/L — AB (ref 38–126)
ALT: 41 U/L (ref 17–63)
AST: 85 U/L — AB (ref 15–41)
Albumin: 2.2 g/dL — ABNORMAL LOW (ref 3.5–5.0)
BILIRUBIN TOTAL: 4.2 mg/dL — AB (ref 0.3–1.2)
Bilirubin, Direct: 1.9 mg/dL — ABNORMAL HIGH (ref 0.1–0.5)
Indirect Bilirubin: 2.3 mg/dL — ABNORMAL HIGH (ref 0.3–0.9)
Total Protein: 4.6 g/dL — ABNORMAL LOW (ref 6.5–8.1)

## 2018-01-01 LAB — CBC
HEMATOCRIT: 33.7 % — AB (ref 40.0–52.0)
HEMOGLOBIN: 11.8 g/dL — AB (ref 13.0–18.0)
MCH: 35.9 pg — ABNORMAL HIGH (ref 26.0–34.0)
MCHC: 34.9 g/dL (ref 32.0–36.0)
MCV: 102.8 fL — ABNORMAL HIGH (ref 80.0–100.0)
Platelets: 257 10*3/uL (ref 150–440)
RBC: 3.28 MIL/uL — AB (ref 4.40–5.90)
RDW: 13.3 % (ref 11.5–14.5)
WBC: 14.5 10*3/uL — AB (ref 3.8–10.6)

## 2018-01-01 LAB — BASIC METABOLIC PANEL
ANION GAP: 6 (ref 5–15)
BUN: 10 mg/dL (ref 6–20)
CALCIUM: 7.5 mg/dL — AB (ref 8.9–10.3)
CO2: 23 mmol/L (ref 22–32)
CREATININE: 0.64 mg/dL (ref 0.61–1.24)
Chloride: 87 mmol/L — ABNORMAL LOW (ref 101–111)
Glucose, Bld: 109 mg/dL — ABNORMAL HIGH (ref 65–99)
Potassium: 4.8 mmol/L (ref 3.5–5.1)
SODIUM: 116 mmol/L — AB (ref 135–145)

## 2018-01-01 LAB — SODIUM: SODIUM: 118 mmol/L — AB (ref 135–145)

## 2018-01-01 MED ORDER — ALBUMIN HUMAN 25 % IV SOLN
25.0000 g | Freq: Once | INTRAVENOUS | Status: AC
  Administered 2018-01-01: 25 g via INTRAVENOUS
  Filled 2018-01-01: qty 100

## 2018-01-01 MED ORDER — FUROSEMIDE 10 MG/ML IJ SOLN: 40.0000 mg | Freq: Once | INTRAMUSCULAR | Status: DC

## 2018-01-01 MED ORDER — BLISTEX MEDICATED EX OINT
TOPICAL_OINTMENT | CUTANEOUS | Status: DC | PRN
  Filled 2018-01-01: qty 6.3

## 2018-01-01 NOTE — Progress Notes (Signed)
Gabriel Darby, MD [address]  [place]  [place], [place] [address]  Main: [phone]  Fax: [phone] Pager: [phone]   Subjective: He underwent ultrasound abdomen today which revealed moderate volume ascites. He reports that he has been able to make some urine since he was started on IV fluids. Reports that his cramps are better. He denies abdominal pain, nausea, vomiting. He is tolerating diet well  Vital signs in last 24 hours: Vitals:   12/31/17 1741 12/31/17 2035 01/01/18 0631 01/01/18 1408  BP: 126/82 113/78 116/77 110/82  Pulse: (!) 107 (!) 106 94 92  Resp: 20  (!) 21 20  Temp: 99.6 F (37.6 C) (!) 97.3 F (36.3 C) 98 F (36.7 C) 97.8 F (36.6 C)  TempSrc: Oral Oral Oral Oral  SpO2: 97% 96% 98%   Weight: 218 lb 14.4 oz (99.3 kg)     Height: 6' (1.829 m)      Weight change:   Intake/Output Summary (Last 24 hours) at 01/01/2018 1721 Last data filed at 01/01/2018 0500 Gross per 24 hour  Intake 577.08 ml  Output 900 ml  Net -322.92 ml     Exam: Heart:: Regular rate and rhythm or S1S2 present Lungs: clear to auscultation Abdomen:soft, distended, dull to percussion, nontender   Lab Results: CBC Latest Ref Rng & Units 01/01/2018 12/31/2017  WBC 3.8 - 10.6 K/uL 14.5(H) 21.0(H)  Hemoglobin 13.0 - 18.0 g/dL 11.8(L) 14.4  Hematocrit 40.0 - 52.0 % 33.7(L) 41.7  Platelets 150 - 440 K/uL 257 364   BMP Latest Ref Rng & Units 01/01/2018 01/01/2018 12/31/2017  Glucose 65 - 99 mg/dL - 109(H) 127(H)  BUN 6 - 20 mg/dL - 10 7  Creatinine 0.61 - 1.24 mg/dL - 0.64 0.74  Sodium 135 - 145 mmol/L 118(LL) 116(LL) 115(LL)  Potassium 3.5 - 5.1 mmol/L - 4.8 5.2(H)  Chloride 101 - 111 mmol/L - 87(L) 79(L)  CO2 22 - 32 mmol/L - 23 25  Calcium 8.9 - 10.3 mg/dL - 7.5(L) 8.5(L)   Hepatic Function Latest Ref Rng & Units 01/01/2018 12/31/2017  Total Protein 6.5 - 8.1 g/dL 4.6(L) 6.3(L)  Albumin 3.5 - 5.0 g/dL 2.2(L) 3.0(L)  AST 15 - 41 U/L 85(H) 140(H)  ALT 17 - 63  U/L 41 62  Alk Phosphatase 38 - 126 U/L 208(H) 287(H)  Total Bilirubin 0.3 - 1.2 mg/dL 4.2(H) 4.7(H)  Bilirubin, Direct 0.1 - 0.5 mg/dL 1.9(H) -   Micro Results: No results found for this or any previous visit (from the past 240 hour(s)). Studies/Results: Dg Chest Portable 1 View  Result Date: 12/31/2017 CLINICAL DATA:  Dyspnea syncope EXAM: PORTABLE CHEST 1 VIEW COMPARISON:  01/03/2015 FINDINGS: Hypoventilation. Decreased lung volume with bibasilar atelectasis. Negative for heart failure or effusion IMPRESSION: Hypoventilation with bibasilar atelectasis. Electronically Signed   By: Franchot Gallo M.D.   On: 12/31/2017 13:13   US Abdomen Limited Ruq  Result Date: 01/01/2018 CLINICAL DATA:  Abnormal liver function tests. History of alcohol abuse and cirrhosis. EXAM: ULTRASOUND ABDOMEN LIMITED RIGHT UPPER QUADRANT COMPARISON:  None. FINDINGS: Gallbladder: No gallstones. Upper limits of normal gallbladder wall thickness. No sonographic Murphy sign noted by sonographer. Common bile duct: Diameter: 5 mm Liver: Diffusely increased parenchymal echogenicity. Nodular liver contour. No focal lesion identified. Portal vein is patent on color Doppler imaging with normal direction of blood flow towards the liver. Moderate volume abdominal ascites. IMPRESSION: 1. Cirrhosis and ascites. 2. No gallstones or biliary dilatation. Electronically Signed   By:  Logan Bores M.D.   On: 01/01/2018 09:12   Medications: I have reviewed the patient's current medications. Scheduled Meds: . aspirin EC  81 mg Oral Daily  . buPROPion  300 mg Oral Daily  . folic acid  1 mg Oral Daily  . levothyroxine  50 mcg Oral QAC breakfast  . multivitamin with minerals  1 tablet Oral Daily  . thiamine  100 mg Oral Daily   Or  . thiamine  100 mg Intravenous Daily  . traZODone  100 mg Oral QHS   Continuous Infusions: PRN Meds:.acetaminophen **OR** acetaminophen, albuterol, bisacodyl, HYDROcodone-acetaminophen, LORazepam **OR**  LORazepam, ondansetron **OR** ondansetron (ZOFRAN) IV, senna-docusate  Active Problems:   Hyponatremia  Gabriel Gray is a 57 y.o. Caucasian male with alcohol induced decompensated cirrhosis presents with altered mental status in the setting of severe hyponatremia  Plan: Hyponatremia: most likely hypervolemic hyponatremia with fluid retention and secondary to decompensated cirrhosis. Serum sodium is stable but not improving - IV fluids were discontinued  - recommend nephrology consult - Monitor serum sodium closely  Decompensated cirrhosis: secondary to alcohol use,child class C, MELD-Na 24 Does not have chronic hepatitis B or C Portal hypertension: manifested by thrombocytopenia, ascites Ascites/volume overload: recommend ultrasound guided therapeutic paracentesis and fluid analysis Diuretics and 2 g sodium diet after resolution of hyponatremia Recommend evaluation for TIPS as outpatient if patient does not tolerate diuretics secondary to hyponatremia and needing serial paracentesis Varices: unknown, recommend EGD as outpatient [place] screening: CT from 09/2017 did not reveal liver lesions, check serum AFP Abstinence from alcohol  Alcoholism: He may have developed Wernicke's encephalopathy and cerebellar ataxia He has elevated B12, normal folate levels, thiamine levels or pendingdetected Continue to administer folate acid, thiamine and multivitamin daily Abstinence from alcohol   Thank you for involving me in the care of this patient.     LOS: 1 day     01/01/2018, 5:21 PM

## 2018-01-01 NOTE — Progress Notes (Signed)
Refusing trazodone tonight

## 2018-01-01 NOTE — Progress Notes (Signed)
[place] [facility] [place] NAME: Gabriel Gray    MR#:  [id number]  DATE OF BIRTH:  February 15, 1961  SUBJECTIVE:  Patient seen and evaluated by me today  he is awake and alert and responds to all verbal commands No seizures No confusion No abdominal pain  REVIEW OF SYSTEMS:    ROS  CONSTITUTIONAL: No documented fever. Has fatigue, weakness. No weight gain, no weight loss.  EYES: No blurry or double vision.  ENT: No tinnitus. No postnasal drip. No redness of the oropharynx.  RESPIRATORY: No cough, no wheeze, no hemoptysis. No dyspnea.  CARDIOVASCULAR: No chest pain. No orthopnea. No palpitations. No syncope.  GASTROINTESTINAL: No nausea, no vomiting or diarrhea. No abdominal pain. No melena or hematochezia.  GENITOURINARY: No dysuria or hematuria.  ENDOCRINE: No polyuria or nocturia. No heat or cold intolerance.  HEMATOLOGY: No anemia. No bruising. No bleeding.  INTEGUMENTARY: No rashes. No lesions.  MUSCULOSKELETAL: No arthritis. No swelling. No gout.  NEUROLOGIC: No numbness, tingling, or ataxia. No seizure-type activity.  PSYCHIATRIC: No anxiety. No insomnia. No ADD.   DRUG ALLERGIES:  No Known Allergies  VITALS:  Blood pressure 116/77, pulse 94, temperature 98 F (36.7 C), temperature source Oral, resp. rate (!) 21, height 6' (1.829 m), weight 99.3 kg (218 lb 14.4 oz), SpO2 98 %.  PHYSICAL EXAMINATION:   Physical Exam  GENERAL:  57 y.o.-year-old patient lying in the bed with no acute distress.  EYES: Pupils equal, round, reactive to light and accommodation. No scleral icterus. Extraocular muscles intact.  HEENT: Head atraumatic, normocephalic. Oropharynx and nasopharynx clear.  NECK:  Supple, no jugular venous distention. No thyroid enlargement, no tenderness.  LUNGS: Normal breath sounds bilaterally, no wheezing, rales, rhonchi. No use of accessory muscles of respiration.  CARDIOVASCULAR: S1, S2 normal. No murmurs, rubs, or gallops.   ABDOMEN: Soft, nontender, distended. Bowel sounds present. No organomegaly or mass.  EXTREMITIES: No cyanosis, clubbing or edema b/l.    NEUROLOGIC: Cranial nerves II through XII are intact. No focal Motor or sensory deficits b/l.   PSYCHIATRIC: The patient is alert and oriented x 3.  SKIN: No obvious rash, lesion, or ulcer.   LABORATORY PANEL:   CBC Recent Labs  Lab 01/01/18 0502  WBC 14.5*  HGB 11.8*  HCT 33.7*  PLT 257   ------------------------------------------------------------------------------------------------------------------ Chemistries  Recent Labs  Lab 12/31/17 1123  01/01/18 0502  NA  --    < > 116*  K  --    < > 4.8  CL  --    < > 87*  CO2  --    < > 23  GLUCOSE  --    < > 109*  BUN  --    < > 10  CREATININE  --    < > 0.64  CALCIUM  --    < > 7.5*  MG 1.5*  --   --   AST  --    < > 85*  ALT  --    < > 41  ALKPHOS  --    < > 208*  BILITOT  --    < > 4.2*   < > = values in this interval not displayed.   ------------------------------------------------------------------------------------------------------------------  Cardiac Enzymes Recent Labs  Lab 12/31/17 1127  TROPONINI <0.03   ------------------------------------------------------------------------------------------------------------------  RADIOLOGY:  Dg Chest Portable 1 View  Result Date: 12/31/2017 CLINICAL DATA:  Dyspnea syncope EXAM: PORTABLE CHEST 1 VIEW COMPARISON:  01/03/2015 FINDINGS: Hypoventilation. Decreased  lung volume with bibasilar atelectasis. Negative for heart failure or effusion IMPRESSION: Hypoventilation with bibasilar atelectasis. Electronically Signed   By: Franchot Gallo M.D.   On: 12/31/2017 13:13   US Abdomen Limited Ruq  Result Date: 01/01/2018 CLINICAL DATA:  Abnormal liver function tests. History of alcohol abuse and cirrhosis. EXAM: ULTRASOUND ABDOMEN LIMITED RIGHT UPPER QUADRANT COMPARISON:  None. FINDINGS: Gallbladder: No gallstones. Upper limits of normal  gallbladder wall thickness. No sonographic Murphy sign noted by sonographer. Common bile duct: Diameter: 5 mm Liver: Diffusely increased parenchymal echogenicity. Nodular liver contour. No focal lesion identified. Portal vein is patent on color Doppler imaging with normal direction of blood flow towards the liver. Moderate volume abdominal ascites. IMPRESSION: 1. Cirrhosis and ascites. 2. No gallstones or biliary dilatation. Electronically Signed   By: Logan Bores M.D.   On: 01/01/2018 09:12     ASSESSMENT AND PLAN:   57 year old male patient with history of cirrhosis of liver, coronary artery disease, hypertension, alcohol abuse currently under hospitalist service for hyponatremia and abdominal distention.  1 decompensated liver disease.  Appreciate gastroenterology follow-up No history of chronic hepatitis B or C Has portal hypertension manifested by ascites and thrombocytopenia Ultrasound-guided paracentesis Diuretics will be started after resolution of hyponatremia EGD recommended as outpatient  2.  Hyponatremia Fluid restriction Discontinue IV fluids Nephrology consultation Follow-up sodium levels closely Appears hypervolemic hyponatremia with fluid retention  3.  Alcohol abuse Thiamine and folic acid supplements Check B12 level  4. leukocytosis improving  5.DVT prophylaxis Sequential compression device to lower extremities  DC subcu heparin in view of liver disease    All the records are reviewed and case discussed with Care Management/Social Worker. Management plans discussed with the patient, family and they are in agreement.  CODE STATUS: Full  DVT Prophylaxis: SCDs  TOTAL TIME TAKING CARE OF THIS PATIENT: 35 minutes.   POSSIBLE D/C IN 2 to 3 DAYS, DEPENDING ON CLINICAL CONDITION.  Saundra Shelling M.D on 01/01/2018 at 1:30 PM  Between 7am to 6pm - Pager - [phone]  After 6pm go to [email or web address] - password [email or web address] [facility]  Office   [phone]  CC: Primary care physician; Gennaro Africa, MD  Note: This dictation was prepared with Dragon dictation along with smaller phrase technology. Any transcriptional errors that result from this process are unintentional.

## 2018-01-02 LAB — CBC
HCT: 37 % — ABNORMAL LOW (ref 40.0–52.0)
Hemoglobin: 13 g/dL (ref 13.0–18.0)
MCH: 36.3 pg — AB (ref 26.0–34.0)
MCHC: 35.1 g/dL (ref 32.0–36.0)
MCV: 103.4 fL — ABNORMAL HIGH (ref 80.0–100.0)
Platelets: 264 10*3/uL (ref 150–440)
RBC: 3.57 MIL/uL — ABNORMAL LOW (ref 4.40–5.90)
RDW: 13.5 % (ref 11.5–14.5)
WBC: 10.9 10*3/uL — ABNORMAL HIGH (ref 3.8–10.6)

## 2018-01-02 LAB — BASIC METABOLIC PANEL
Anion gap: 7 (ref 5–15)
BUN: 7 mg/dL (ref 6–20)
CALCIUM: 8.1 mg/dL — AB (ref 8.9–10.3)
CHLORIDE: 90 mmol/L — AB (ref 101–111)
CO2: 25 mmol/L (ref 22–32)
CREATININE: 0.58 mg/dL — AB (ref 0.61–1.24)
GFR calc non Af Amer: >60 mL/min (ref >60)
Glucose, Bld: 117 mg/dL — ABNORMAL HIGH (ref 65–99)
Potassium: 4.3 mmol/L (ref 3.5–5.1)
Sodium: 122 mmol/L — ABNORMAL LOW (ref 135–145)

## 2018-01-02 MED ORDER — RIFAXIMIN 550 MG PO TABS
550.0000 mg | ORAL_TABLET | Freq: Two times a day (BID) | ORAL | Status: DC
  Administered 2018-01-02 – 2018-01-05 (×6): 550 mg via ORAL
  Filled 2018-01-02 (×6): qty 1

## 2018-01-02 NOTE — Progress Notes (Signed)
[facility] NAME: Gabriel Gray    MR#:  [id number]  DATE OF BIRTH:  [birth year]  SUBJECTIVE:  Patient seen and evaluated by me today  he is awake and alert and responds to all verbal commands No seizures No confusion  abdominal pain better  REVIEW OF SYSTEMS:    ROS  CONSTITUTIONAL: No documented fever. Has fatigue, weakness. No weight gain, no weight loss.  EYES: No blurry or double vision.  ENT: No tinnitus. No postnasal drip. No redness of the oropharynx.  RESPIRATORY: No cough, no wheeze, no hemoptysis. No dyspnea.  CARDIOVASCULAR: No chest pain. No orthopnea. No palpitations. No syncope.  GASTROINTESTINAL: No nausea, no vomiting or diarrhea. No abdominal pain. No melena or hematochezia.  GENITOURINARY: No dysuria or hematuria.  ENDOCRINE: No polyuria or nocturia. No heat or cold intolerance.  HEMATOLOGY: No anemia. No bruising. No bleeding.  INTEGUMENTARY: No rashes. No lesions.  MUSCULOSKELETAL: No arthritis. No swelling. No gout.  NEUROLOGIC: No numbness, tingling, or ataxia. No seizure-type activity.  PSYCHIATRIC: No anxiety. No insomnia. No ADD.   DRUG ALLERGIES:  No Known Allergies  VITALS:  Blood pressure 114/82, pulse 99, temperature (!) 97.4 F (36.3 C), temperature source Oral, resp. rate 19, height 6' (1.829 m), weight 99.3 kg (218 lb 14.4 oz), SpO2 97 %.  PHYSICAL EXAMINATION:   Physical Exam  GENERAL:  57 y.o.-year-old patient lying in the bed with no acute distress.  EYES: Pupils equal, round, reactive to light and accommodation. No scleral icterus. Extraocular muscles intact.  HEENT: Head atraumatic, normocephalic. Oropharynx and nasopharynx clear.  NECK:  Supple, no jugular venous distention. No thyroid enlargement, no tenderness.  LUNGS: Normal breath sounds bilaterally, no wheezing, rales, rhonchi. No use of accessory muscles of respiration.  CARDIOVASCULAR: S1, S2 normal. No murmurs, rubs, or  gallops.  ABDOMEN: Soft, nontender, distended. Bowel sounds present. No organomegaly or mass.  EXTREMITIES: No cyanosis, clubbing or edema b/l.    NEUROLOGIC: Cranial nerves II through XII are intact. No focal Motor or sensory deficits b/l.   PSYCHIATRIC: The patient is alert and oriented x 3.  SKIN: No obvious rash, lesion, or ulcer.   LABORATORY PANEL:   CBC Recent Labs  Lab 01/02/18 0455  WBC 10.9*  HGB 13.0  HCT 37.0*  PLT 264   ------------------------------------------------------------------------------------------------------------------ Chemistries  Recent Labs  Lab 12/31/17 1123  01/01/18 0502  01/02/18 0455  NA  --    < > 116*   < > 122*  K  --    < > 4.8  --  4.3  CL  --    < > 87*  --  90*  CO2  --    < > 23  --  25  GLUCOSE  --    < > 109*  --  117*  BUN  --    < > 10  --  7  CREATININE  --    < > 0.64  --  0.58*  CALCIUM  --    < > 7.5*  --  8.1*  MG 1.5*  --   --   --   --   AST  --    < > 85*  --   --   ALT  --    < > 41  --   --   ALKPHOS  --    < > 208*  --   --   BILITOT  --    < >  4.2*  --   --    < > = values in this interval not displayed.   ------------------------------------------------------------------------------------------------------------------  Cardiac Enzymes Recent Labs  Lab 12/31/17 1127  TROPONINI <0.03   ------------------------------------------------------------------------------------------------------------------  RADIOLOGY:  Dg Chest Portable 1 View  Result Date: 12/31/2017 CLINICAL DATA:  Dyspnea syncope EXAM: PORTABLE CHEST 1 VIEW COMPARISON:  01/03/2015 FINDINGS: Hypoventilation. Decreased lung volume with bibasilar atelectasis. Negative for heart failure or effusion IMPRESSION: Hypoventilation with bibasilar atelectasis. Electronically Signed   By: Franchot Gallo M.D.   On: 12/31/2017 13:13   US Abdomen Limited Ruq  Result Date: 01/01/2018 CLINICAL DATA:  Abnormal liver function tests. History of alcohol abuse  and cirrhosis. EXAM: ULTRASOUND ABDOMEN LIMITED RIGHT UPPER QUADRANT COMPARISON:  None. FINDINGS: Gallbladder: No gallstones. Upper limits of normal gallbladder wall thickness. No sonographic Murphy sign noted by sonographer. Common bile duct: Diameter: 5 mm Liver: Diffusely increased parenchymal echogenicity. Nodular liver contour. No focal lesion identified. Portal vein is patent on color Doppler imaging with normal direction of blood flow towards the liver. Moderate volume abdominal ascites. IMPRESSION: 1. Cirrhosis and ascites. 2. No gallstones or biliary dilatation. Electronically Signed   By: Logan Bores M.D.   On: 01/01/2018 09:12     ASSESSMENT AND PLAN:   57 year old male patient with history of cirrhosis of liver, coronary artery disease, hypertension, alcohol abuse currently under hospitalist service for hyponatremia and abdominal distention.  1 decompensated liver disease.  Appreciate gastroenterology follow-up No history of chronic hepatitis B or C Has portal hypertension manifested by ascites and thrombocytopenia Ultrasound-guided paracentesis by IR tmrw Diuretics will be started after resolution of hyponatremia EGD recommended as outpatient  2.  Hyponatremia improving Fluid restriction Discontinue IV fluids Nephrology consultation Follow-up sodium levels closely Appears hypervolemic hyponatremia with fluid retention  3.  Alcohol abuse Thiamine and folic acid supplements  4. leukocytosis improved  5.DVT prophylaxis Sequential compression device to lower extremities  DC subcu heparin in view of liver disease    All the records are reviewed and case discussed with Care Management/Social Worker. Management plans discussed with the patient, family and they are in agreement.  CODE STATUS: Full  DVT Prophylaxis: SCDs  TOTAL TIME TAKING CARE OF THIS PATIENT: 35 minutes.   POSSIBLE D/C IN 2 to 3 DAYS, DEPENDING ON CLINICAL CONDITION.  Saundra Shelling M.D on 01/02/2018  at 11:50 AM  Between 7am to 6pm - Pager - [phone]  After 6pm go to [email or web address] - password [email or web address] [facility]  Office  [phone]  CC: Primary care physician; Gennaro Africa, MD  Note: This dictation was prepared with Dragon dictation along with smaller phrase technology. Any transcriptional errors that result from this process are unintentional.

## 2018-01-02 NOTE — Progress Notes (Signed)
Gabriel Darby, MD [address]  [place]  [place], [place] [address]  Main: [phone]  Fax: [phone] Pager: [phone]   Subjective: His serum sodium levels are improving. Tolerating by mouth diet well. He did not have a bowel movement but he is worried about having diarrhea and requesting Imodium  Vital signs in last 24 hours: Vitals:   01/01/18 1408 01/01/18 1956 01/02/18 0519 01/02/18 1407  BP: 110/82 110/75 114/82 100/68  Pulse: 92 89 99 96  Resp: 20 18 19 18   Temp: 97.8 F (36.6 C) 97.8 F (36.6 C) (!) 97.4 F (36.3 C) (!) 97.1 F (36.2 C)  TempSrc: Oral Oral Oral Oral  SpO2:  100% 97% 96%  Weight:      Height:       Weight change:   Intake/Output Summary (Last 24 hours) at 01/02/2018 1919 Last data filed at 01/02/2018 1302 Gross per 24 hour  Intake 480 ml  Output 1700 ml  Net -1220 ml     Exam: Heart:: Regular rate and rhythm or S1S2 present Lungs: clear to auscultation Abdomen:soft, distended, dull to percussion, nontender   Lab Results: CBC Latest Ref Rng & Units 01/02/2018 01/01/2018 12/31/2017  WBC 3.8 - 10.6 K/uL 10.9(H) 14.5(H) 21.0(H)  Hemoglobin 13.0 - 18.0 g/dL 13.0 11.8(L) 14.4  Hematocrit 40.0 - 52.0 % 37.0(L) 33.7(L) 41.7  Platelets 150 - 440 K/uL 264 257 364   BMP Latest Ref Rng & Units 01/02/2018 01/01/2018 01/01/2018  Glucose 65 - 99 mg/dL 117(H) - 109(H)  BUN 6 - 20 mg/dL 7 - 10  Creatinine 0.61 - 1.24 mg/dL 0.58(L) - 0.64  Sodium 135 - 145 mmol/L 122(L) 118(LL) 116(LL)  Potassium 3.5 - 5.1 mmol/L 4.3 - 4.8  Chloride 101 - 111 mmol/L 90(L) - 87(L)  CO2 22 - 32 mmol/L 25 - 23  Calcium 8.9 - 10.3 mg/dL 8.1(L) - 7.5(L)   Hepatic Function Latest Ref Rng & Units 01/01/2018 12/31/2017  Total Protein 6.5 - 8.1 g/dL 4.6(L) 6.3(L)  Albumin 3.5 - 5.0 g/dL 2.2(L) 3.0(L)  AST 15 - 41 U/L 85(H) 140(H)  ALT 17 - 63 U/L 41 62  Alk Phosphatase 38 - 126 U/L 208(H) 287(H)  Total Bilirubin 0.3 - 1.2 mg/dL 4.2(H) 4.7(H)  Bilirubin,  Direct 0.1 - 0.5 mg/dL 1.9(H) -   Micro Results: No results found for this or any previous visit (from the past 240 hour(s)). Studies/Results: US Abdomen Limited Ruq  Result Date: 01/01/2018 CLINICAL DATA:  Abnormal liver function tests. History of alcohol abuse and cirrhosis. EXAM: ULTRASOUND ABDOMEN LIMITED RIGHT UPPER QUADRANT COMPARISON:  None. FINDINGS: Gallbladder: No gallstones. Upper limits of normal gallbladder wall thickness. No sonographic Murphy sign noted by sonographer. Common bile duct: Diameter: 5 mm Liver: Diffusely increased parenchymal echogenicity. Nodular liver contour. No focal lesion identified. Portal vein is patent on color Doppler imaging with normal direction of blood flow towards the liver. Moderate volume abdominal ascites. IMPRESSION: 1. Cirrhosis and ascites. 2. No gallstones or biliary dilatation. Electronically Signed   By: Logan Bores M.D.   On: 01/01/2018 09:12   Medications: I have reviewed the patient's current medications. Scheduled Meds: . aspirin EC  81 mg Oral Daily  . buPROPion  300 mg Oral Daily  . folic acid  1 mg Oral Daily  . levothyroxine  50 mcg Oral QAC breakfast  . multivitamin with minerals  1 tablet Oral Daily  . thiamine  100 mg Oral Daily   Or  .  thiamine  100 mg Intravenous Daily  . traZODone  100 mg Oral QHS   Continuous Infusions: PRN Meds:.acetaminophen **OR** acetaminophen, albuterol, bisacodyl, HYDROcodone-acetaminophen, lip balm, LORazepam **OR** LORazepam, ondansetron **OR** ondansetron (ZOFRAN) IV, senna-docusate  Active Problems:   Hyponatremia  Gabriel Gray is a 57 y.o. Caucasian male with alcohol induced decompensated cirrhosis presents with altered mental status in the setting of severe hyponatremia  Plan: Hyponatremia: most likely hypervolemic hyponatremia with fluid retention and secondary to decompensated cirrhosis. Serum sodium is improving - nephrology consult is pending - Monitor serum sodium  closely  Decompensated cirrhosis: secondary to alcohol use,child class C, MELD-Na 24 Does not have chronic hepatitis B or C Portal hypertension: manifested by thrombocytopenia, ascites Ascites/volume overload: recommend ultrasound guided therapeutic paracentesis and fluid analysis Diuretics and 2 g sodium diet after resolution of hyponatremia Recommend evaluation for TIPS as outpatient if patient does not tolerate diuretics secondary to hyponatremia and needs serial paracentesis Varices: unknown, recommend EGD as outpatient [place] screening: CT from 09/2017 did not reveal liver lesions, serum AFP pending Hepatic encephalopathy: start rifaximin Abstinence from alcohol  Alcoholism: He may have developed Wernicke's encephalopathy and cerebellar ataxia He has elevated B12, normal folate levels, thiamine levels are pending  Continue to administer folate acid, thiamine and multivitamin daily Abstinence from alcohol   Thank you for involving me in the care of this patient. Dr. Vicente Males will cover from tomorrow     LOS: 2 days   Gabriel Gray 01/02/2018, 7:19 PM

## 2018-01-03 ENCOUNTER — Inpatient Hospital Stay: Payer: BLUE CROSS/BLUE SHIELD

## 2018-01-03 LAB — BODY FLUID CELL COUNT WITH DIFFERENTIAL
Eos, Fluid: 0 %
LYMPHS FL: 25 %
MONOCYTE-MACROPHAGE-SEROUS FLUID: 69 %
NEUTROPHIL FLUID: 6 %
Other Cells, Fluid: 0 %
Total Nucleated Cell Count, Fluid: 72 cu mm

## 2018-01-03 LAB — ALBUMIN, PLEURAL OR PERITONEAL FLUID

## 2018-01-03 LAB — BASIC METABOLIC PANEL
ANION GAP: 4 — AB (ref 5–15)
BUN: 6 mg/dL (ref 6–20)
CALCIUM: 7.8 mg/dL — AB (ref 8.9–10.3)
CO2: 26 mmol/L (ref 22–32)
Chloride: 94 mmol/L — ABNORMAL LOW (ref 101–111)
Creatinine, Ser: 0.55 mg/dL — ABNORMAL LOW (ref 0.61–1.24)
Glucose, Bld: 116 mg/dL — ABNORMAL HIGH (ref 65–99)
Potassium: 4.7 mmol/L (ref 3.5–5.1)
Sodium: 124 mmol/L — ABNORMAL LOW (ref 135–145)

## 2018-01-03 LAB — HIV ANTIBODY (ROUTINE TESTING W REFLEX): HIV Screen 4th Generation wRfx: NONREACTIVE

## 2018-01-03 LAB — PROTEIN, PLEURAL OR PERITONEAL FLUID: Total protein, fluid: <3 g/dL

## 2018-01-03 LAB — AFP TUMOR MARKER: AFP, Serum, Tumor Marker: 4.9 ng/mL (ref 0.0–8.3)

## 2018-01-03 MED ORDER — ALBUMIN HUMAN 25 % IV SOLN
25.0000 g | Freq: Once | INTRAVENOUS | Status: AC
  Administered 2018-01-03: 25 g via INTRAVENOUS
  Filled 2018-01-03: qty 100

## 2018-01-03 NOTE — Clinical Social Work Placement (Signed)
   CLINICAL SOCIAL WORK PLACEMENT  NOTE  Date:  01/03/2018  Patient Details  Name: Armari Fussell MRN: [id number] Date of Birth: 1961-09-07  Clinical Social Work is seeking post-discharge placement for this patient at the [place] level of care (*CSW will initial, date and re-position this form in  chart as items are completed):  Yes   Patient/family provided with [facility] Work Department's list of facilities offering this level of care within the geographic area requested by the patient (or if unable, by the patient's family).  Yes   Patient/family informed of their freedom to choose among providers that offer the needed level of care, that participate in Medicare, Medicaid or managed care program needed by the patient, have an available bed and are willing to accept the patient.  Yes   Patient/family informed of [facility]'s ownership interest in [facility], as well as of the fact that they are under no obligation to receive care at these facilities.  PASRR submitted to EDS on       PASRR number received on       Existing PASRR number confirmed on 01/03/18     FL2 transmitted to all facilities in geographic area requested by pt/family on 01/03/18     FL2 transmitted to all facilities within larger geographic area on       Patient informed that his/her managed care company has contracts with or will negotiate with certain facilities, including the following:        Yes   Patient/family informed of bed offers received.  Patient chooses bed at [facility] )     Physician recommends and patient chooses bed at      Patient to be transferred to   on  .  Patient to be transferred to facility by       Patient family notified on   of transfer.  Name of family member notified:        PHYSICIAN       Additional Comment:    _______________________________________________ Moosa Bueche, [name],  LCSW 01/03/2018, 6:46 PM

## 2018-01-03 NOTE — NC FL2 (Signed)
[place] LEVEL OF CARE SCREENING TOOL     IDENTIFICATION  Patient Name: Gabriel Gray Birthdate: 1961/01/21 Sex: male Admission Date (Current Location): 12/31/2017  [place] and [place] Number:  Engineering geologist and Address:  [facility], [address], [place], [place] [address]      Provider Number: [id number]  Attending Physician Name and Address:  Saundra Shelling, MD  Relative Name and Phone Number:       Current Level of Care: Hospital Recommended Level of Care: [facility] Prior Approval Number:    Date Approved/Denied:   PASRR Number: [id number] )  Discharge Plan: SNF    Current Diagnoses: Patient Active Problem List   Diagnosis Date Noted  . Hyponatremia 12/31/2017    Orientation RESPIRATION BLADDER Height & Weight     Self, Time, Situation, Place  Normal Continent Weight: 218 lb 14.4 oz (99.3 kg) Height:  6' (182.9 cm)  BEHAVIORAL SYMPTOMS/MOOD NEUROLOGICAL BOWEL NUTRITION STATUS      Continent Diet(Diet: Regular )  AMBULATORY STATUS COMMUNICATION OF NEEDS Skin   Extensive Assist Verbally Normal                       Personal Care Assistance Level of Assistance  Bathing, Feeding, Dressing Bathing Assistance: Limited assistance Feeding assistance: Independent Dressing Assistance: Limited assistance     Functional Limitations Info  Sight, Hearing, Speech Sight Info: Adequate Hearing Info: Adequate Speech Info: Adequate    SPECIAL CARE FACTORS FREQUENCY  PT (By licensed PT), OT (By licensed OT)     PT Frequency: (5) OT Frequency: (5)            Contractures      Additional Factors Info  Code Status, Allergies Code Status Info: (Full Code. ) Allergies Info: (No Known Allergies. )           Current Medications (01/03/2018):  This is the current hospital active medication list Current Facility-Administered Medications  Medication Dose Route Frequency Provider Last  Rate Last Dose  . acetaminophen (TYLENOL) tablet 650 mg  650 mg Oral Q6H PRN Demetrios Loll, MD   650 mg at 01/03/18 1129   Or  . acetaminophen (TYLENOL) suppository 650 mg  650 mg Rectal Q6H PRN Demetrios Loll, MD      . albuterol (PROVENTIL) (2.5 MG/3ML) 0.083% nebulizer solution 2.5 mg  2.5 mg Nebulization Q2H PRN Demetrios Loll, MD      . aspirin EC tablet 81 mg  81 mg Oral Daily Demetrios Loll, MD   81 mg at 01/03/18 3382  . bisacodyl (DULCOLAX) EC tablet 5 mg  5 mg Oral Daily PRN Demetrios Loll, MD   5 mg at 01/03/18 0819  . buPROPion (WELLBUTRIN XL) 24 hr tablet 300 mg  300 mg Oral Daily Demetrios Loll, MD   300 mg at 01/03/18 0818  . folic acid (FOLVITE) tablet 1 mg  1 mg Oral Daily Demetrios Loll, MD   1 mg at 01/03/18 0819  . HYDROcodone-acetaminophen (NORCO/VICODIN) 5-325 MG per tablet 1-2 tablet  1-2 tablet Oral Q4H PRN Demetrios Loll, MD      . levothyroxine (SYNTHROID, LEVOTHROID) tablet 50 mcg  50 mcg Oral QAC breakfast Demetrios Loll, MD   50 mcg at 01/03/18 [phone]  . lip balm (BLISTEX) ointment   Topical PRN Demetrios Loll, MD      . LORazepam (ATIVAN) tablet 1 mg  1 mg Oral Q6H PRN Demetrios Loll, MD  Or  . LORazepam (ATIVAN) injection 1 mg  1 mg Intravenous Q6H PRN Demetrios Loll, MD      . multivitamin with minerals tablet 1 tablet  1 tablet Oral Daily Demetrios Loll, MD   1 tablet at 01/03/18 0818  . ondansetron (ZOFRAN) tablet 4 mg  4 mg Oral Q6H PRN Demetrios Loll, MD       Or  . ondansetron [facility]) injection 4 mg  4 mg Intravenous Q6H PRN Demetrios Loll, MD   4 mg at 01/02/18 2224  . rifaximin (XIFAXAN) tablet 550 mg  550 mg Oral BID Lin Landsman, MD   550 mg at 01/03/18 0819  . senna-docusate (Senokot-S) tablet 1 tablet  1 tablet Oral QHS PRN Demetrios Loll, MD   1 tablet at 01/03/18 [phone]  . thiamine (VITAMIN B-1) tablet 100 mg  100 mg Oral Daily Demetrios Loll, MD   100 mg at 01/03/18 2202   Or  . thiamine (B-1) injection 100 mg  100 mg Intravenous Daily Demetrios Loll, MD      . traZODone (DESYREL) tablet 100 mg  100 mg Oral  QHS Demetrios Loll, MD   100 mg at 01/02/18 2116     Discharge Medications: Please see discharge summary for a list of discharge medications.  Relevant Imaging Results:  Relevant Lab Results:   Additional Information (SSN: [id number])  Arbor Leer, [name], LCSW

## 2018-01-03 NOTE — Consult Note (Signed)
[facility]  CONSULT NOTE    Date: 01/03/2018                  Patient Name:  Gabriel Gray  MRN: [id number]  DOB: October 30, 1960  Age / Sex: 57 y.o., male         PCP: Gennaro Africa, MD                 Service Requesting Consult: Dr. Estanislado Pandy                 Reason for Consult: Hyponatremia            History of Present Illness: Gabriel Gray is a 57 y.o. white male with hepatic cirrhosis, alcohol abuse, hypertension, depression, anxiety and coronary artery disease, who was admitted to [facility] on 12/31/2017 for Hyponatremia [E87.1]  Sodium admitted with 115. Today at 124.   Large volume paracentesis today. 5 Liters removed.    Medications: Outpatient medications: Medications Prior to Admission  Medication Sig Dispense Refill Last Dose  . aspirin (ECOTRIN LOW STRENGTH) 81 MG EC tablet Take 1 tablet by mouth daily.   Unknown at Unknown  . B Complex Vitamins (VITAMIN B COMPLEX PO) Take 1 tablet by mouth daily.   Unknown at Unknown  . buPROPion (WELLBUTRIN XL) 300 MG 24 hr tablet Take 1 tablet by mouth daily.   Unknown at Unknown  . levothyroxine (SYNTHROID, LEVOTHROID) 50 MCG tablet Take 1 tablet by mouth daily.   Unknown at Unknown  . ondansetron (ZOFRAN-ODT) 8 MG disintegrating tablet Take 1 tablet by mouth 3 (three) times daily as needed.  0 Unknown at Unknown  . traZODone (DESYREL) 50 MG tablet Take 100 mg by mouth at bedtime.  4 Unknown    Current medications: Current Facility-Administered Medications  Medication Dose Route Frequency Provider Last Rate Last Dose  . acetaminophen (TYLENOL) tablet 650 mg  650 mg Oral Q6H PRN Demetrios Loll, MD   650 mg at 01/03/18 1129   Or  . acetaminophen (TYLENOL) suppository 650 mg  650 mg Rectal Q6H PRN Demetrios Loll, MD      . albumin human 25 % solution 25 g  25 g Intravenous Once Pyreddy, Reatha Harps, MD      . albuterol (PROVENTIL) (2.5 MG/3ML) 0.083% nebulizer solution 2.5 mg  2.5 mg Nebulization Q2H PRN  Demetrios Loll, MD      . aspirin EC tablet 81 mg  81 mg Oral Daily Demetrios Loll, MD   81 mg at 01/03/18 1941  . bisacodyl (DULCOLAX) EC tablet 5 mg  5 mg Oral Daily PRN Demetrios Loll, MD   5 mg at 01/03/18 0819  . buPROPion (WELLBUTRIN XL) 24 hr tablet 300 mg  300 mg Oral Daily Demetrios Loll, MD   300 mg at 01/03/18 0818  . folic acid (FOLVITE) tablet 1 mg  1 mg Oral Daily Demetrios Loll, MD   1 mg at 01/03/18 0819  . HYDROcodone-acetaminophen (NORCO/VICODIN) 5-325 MG per tablet 1-2 tablet  1-2 tablet Oral Q4H PRN Demetrios Loll, MD      . levothyroxine (SYNTHROID, LEVOTHROID) tablet 50 mcg  50 mcg Oral QAC breakfast Demetrios Loll, MD   50 mcg at 01/03/18 [phone]  . lip balm (BLISTEX) ointment   Topical PRN Demetrios Loll, MD      . LORazepam (ATIVAN) tablet 1 mg  1 mg Oral Q6H PRN Demetrios Loll, MD       Or  . LORazepam (ATIVAN)  injection 1 mg  1 mg Intravenous Q6H PRN Demetrios Loll, MD      . multivitamin with minerals tablet 1 tablet  1 tablet Oral Daily Demetrios Loll, MD   1 tablet at 01/03/18 0818  . ondansetron (ZOFRAN) tablet 4 mg  4 mg Oral Q6H PRN Demetrios Loll, MD       Or  . ondansetron [facility]) injection 4 mg  4 mg Intravenous Q6H PRN Demetrios Loll, MD   4 mg at 01/02/18 2224  . rifaximin (XIFAXAN) tablet 550 mg  550 mg Oral BID Lin Landsman, MD   550 mg at 01/03/18 0819  . senna-docusate (Senokot-S) tablet 1 tablet  1 tablet Oral QHS PRN Demetrios Loll, MD   1 tablet at 01/03/18 [phone]  . thiamine (VITAMIN B-1) tablet 100 mg  100 mg Oral Daily Demetrios Loll, MD   100 mg at 01/03/18 2025   Or  . thiamine (B-1) injection 100 mg  100 mg Intravenous Daily Demetrios Loll, MD      . traZODone (DESYREL) tablet 100 mg  100 mg Oral QHS Demetrios Loll, MD   100 mg at 01/02/18 2116      Allergies: No Known Allergies    Past Medical History: Past Medical History:  Diagnosis Date  . Anxiety   . Coronary artery disease   . Depression   . Hypertension      Past Surgical History: History reviewed. No pertinent surgical  history.   Family History: Family History  Problem Relation Age of Onset  . Hypertension Mother   . Hypertension Father   . Prostate cancer Father      Social History: Social History   Socioeconomic History  . Marital status: Married    Spouse name: Not on file  . Number of children: Not on file  . Years of education: Not on file  . Highest education level: Not on file  Occupational History  . Not on file  Social Needs  . Financial resource strain: Not on file  . Food insecurity:    Worry: Not on file    Inability: Not on file  . Transportation needs:    Medical: Not on file    Non-medical: Not on file  Tobacco Use  . Smoking status: Never Smoker  . Smokeless tobacco: Never Used  Substance and Sexual Activity  . Alcohol use: Yes    Alcohol/week: 1.2 - 1.8 oz    Types: 2 - 3 Glasses of wine per week    Comment: per night  . Drug use: No  . Sexual activity: Yes  Lifestyle  . Physical activity:    Days per week: Not on file    Minutes per session: Not on file  . Stress: Not on file  Relationships  . Social connections:    Talks on phone: Not on file    Gets together: Not on file    Attends religious service: Not on file    Active member of club or organization: Not on file    Attends meetings of clubs or organizations: Not on file    Relationship status: Not on file  . Intimate partner violence:    Fear of current or ex partner: Not on file    Emotionally abused: Not on file    Physically abused: Not on file    Forced sexual activity: Not on file  Other Topics Concern  . Not on file  Social History Narrative  . Not on file  Review of Systems: Review of Systems  Constitutional: Positive for malaise/fatigue. Negative for chills, diaphoresis, fever and weight loss.  HENT: Negative.  Negative for congestion, ear discharge, ear pain, hearing loss, nosebleeds, sinus pain, sore throat and tinnitus.   Eyes: Negative for blurred vision, double vision,  photophobia, pain, discharge and redness.  Respiratory: Positive for cough, shortness of breath and wheezing. Negative for hemoptysis, sputum production and stridor.   Cardiovascular: Positive for leg swelling and PND. Negative for chest pain, palpitations, orthopnea and claudication.  Gastrointestinal: Negative for abdominal pain, blood in stool, constipation, diarrhea, heartburn, melena, nausea and vomiting.  Genitourinary: Negative for dysuria, flank pain, frequency, hematuria and urgency.  Musculoskeletal: Negative for back pain, falls, joint pain, myalgias and neck pain.  Skin: Negative.  Negative for itching and rash.  Neurological: Negative for dizziness, tingling, tremors, sensory change, speech change, focal weakness, seizures, loss of consciousness, weakness and headaches.  Endo/Heme/Allergies: Negative for environmental allergies and polydipsia. Does not bruise/bleed easily.  Psychiatric/Behavioral: Negative for depression, hallucinations, memory loss, substance abuse and suicidal ideas. The patient is not nervous/anxious and does not have insomnia.     Vital Signs: Blood pressure (!) 89/65, pulse 91, temperature 98.5 F (36.9 C), temperature source Oral, resp. rate 16, height 6' (1.829 m), weight 99.3 kg (218 lb 14.4 oz), SpO2 100 %.  Weight trends: Filed Weights   12/31/17 1741  Weight: 99.3 kg (218 lb 14.4 oz)    Physical Exam: General: NAD,   Head: Normocephalic, atraumatic. Moist oral mucosal membranes  Eyes: Anicteric, PERRL  Neck: Supple, trachea midline  Lungs:  Clear to auscultation  Heart: Regular rate and rhythm  Abdomen:  Soft, nontender, +ascites  Extremities: 1+ peripheral edema.  Neurologic: Nonfocal, moving all four extremities  Skin: No lesions        Lab results: Basic Metabolic Panel: Recent Labs  Lab 12/31/17 1123  01/01/18 0502 01/01/18 1618 01/02/18 0455 01/03/18 0503  NA  --    < > 116* 118* 122* 124*  K  --    < > 4.8  --  4.3 4.7  CL   --    < > 87*  --  90* 94*  CO2  --    < > 23  --  25 26  GLUCOSE  --    < > 109*  --  117* 116*  BUN  --    < > 10  --  7 6  CREATININE  --    < > 0.64  --  0.58* 0.55*  CALCIUM  --    < > 7.5*  --  8.1* 7.8*  MG 1.5*  --   --   --   --   --    < > = values in this interval not displayed.    Liver Function Tests: Recent Labs  Lab 12/31/17 1127 01/01/18 0502  AST 140* 85*  ALT 62 41  ALKPHOS 287* 208*  BILITOT 4.7* 4.2*  PROT 6.3* 4.6*  ALBUMIN 3.0* 2.2*   No results for input(s): LIPASE, AMYLASE in the last 168 hours. Recent Labs  Lab 12/31/17 1123  AMMONIA 17    CBC: Recent Labs  Lab 12/31/17 1127 01/01/18 0502 01/02/18 0455  WBC 21.0* 14.5* 10.9*  HGB 14.4 11.8* 13.0  HCT 41.7 33.7* 37.0*  MCV 101.0* 102.8* 103.4*  PLT 364 257 264    Cardiac Enzymes: Recent Labs  Lab 12/31/17 1127  TROPONINI <0.03    BNP: Invalid input(s): POCBNP  CBG: No results for input(s): GLUCAP in the last 168 hours.  Microbiology: No results found for this or any previous visit.  Coagulation Studies: No results for input(s): LABPROT, INR in the last 72 hours.  Urinalysis: No results for input(s): COLORURINE, LABSPEC, PHURINE, GLUCOSEU, HGBUR, BILIRUBINUR, KETONESUR, PROTEINUR, UROBILINOGEN, NITRITE, LEUKOCYTESUR in the last 72 hours.  Invalid input(s): APPERANCEUR    Imaging: US Paracentesis  Result Date: 01/03/2018 CLINICAL DATA:  Abdominal ascites, alcoholic cirrhosis EXAM: ULTRASOUND GUIDED PARACENTESIS TECHNIQUE: The procedure, risks (including but not limited to bleeding, infection, organ damage ), benefits, and alternatives were explained to the patient. Questions regarding the procedure were encouraged and answered. The patient understands and consents to the procedure. Survey ultrasound of the abdomen was performed and an appropriate skin entry site in the right upper abdomen was selected. Skin site was marked, prepped with chlorhexadine, draped in usual sterile  fashion, and infiltrated locally with 1% lidocaine. A Safe-T-Centesis needle was advanced into the peritoneal space until fluid could be aspirated. The sheath was advanced and the needle removed. 5 L of clear yellow ascites were aspirated (maximum requested volume). Samples were sent for the requested laboratory studies. The patient tolerated the procedure well. COMPLICATIONS: COMPLICATIONS none IMPRESSION: Technically successful ultrasound guided paracentesis, removing 5 L ascites. Electronically Signed   By: Lucrezia Europe M.D.   On: 01/03/2018 11:27      Assessment & Plan: Gabriel Gray is a 57 y.o. white male with hepatic cirrhosis, alcohol abuse, hypertension, depression, anxiety and coronary artery disease, who was admitted to [facility] on 12/31/2017 for Hyponatremia [E87.1]  1. Hyponatremia 2. Hepatic Cirrhosis 3. Alcohol abuse 4. Ascites: status post large volume paracentesis on 4/22.  5. Hyperkalemia on admission  Plan: Continue fluid restriction May start on loop diuretic if no improvement     LOS: 3 Tayton Gray 4/22/20193:41 PM

## 2018-01-03 NOTE — Progress Notes (Signed)
[facility] [place] NAME: Lander Eslick    MR#:  [id number]  DATE OF BIRTH:  1961-05-16  SUBJECTIVE:  Patient seen and evaluated by me today  he is awake and alert and responds to all verbal commands No seizures No confusion  abdominal pain better Had Paracentesis today  REVIEW OF SYSTEMS:    ROS  CONSTITUTIONAL: No documented fever. Has fatigue, weakness. No weight gain, no weight loss.  EYES: No blurry or double vision.  ENT: No tinnitus. No postnasal drip. No redness of the oropharynx.  RESPIRATORY: No cough, no wheeze, no hemoptysis. No dyspnea.  CARDIOVASCULAR: No chest pain. No orthopnea. No palpitations. No syncope.  GASTROINTESTINAL: No nausea, no vomiting or diarrhea. No abdominal pain. No melena or hematochezia.  GENITOURINARY: No dysuria or hematuria.  ENDOCRINE: No polyuria or nocturia. No heat or cold intolerance.  HEMATOLOGY: No anemia. No bruising. No bleeding.  INTEGUMENTARY: No rashes. No lesions.  MUSCULOSKELETAL: No arthritis. No swelling. No gout.  NEUROLOGIC: No numbness, tingling, or ataxia. No seizure-type activity.  PSYCHIATRIC: No anxiety. No insomnia. No ADD.   DRUG ALLERGIES:  No Known Allergies  VITALS:  Blood pressure (!) 89/65, pulse 91, temperature 98.5 F (36.9 C), temperature source Oral, resp. rate 16, height 6' (1.829 m), weight 99.3 kg (218 lb 14.4 oz), SpO2 100 %.  PHYSICAL EXAMINATION:   Physical Exam  GENERAL:  57 y.o.-year-old patient lying in the bed with no acute distress.  EYES: Pupils equal, round, reactive to light and accommodation. No scleral icterus. Extraocular muscles intact.  HEENT: Head atraumatic, normocephalic. Oropharynx and nasopharynx clear.  NECK:  Supple, no jugular venous distention. No thyroid enlargement, no tenderness.  LUNGS: Normal breath sounds bilaterally, no wheezing, rales, rhonchi. No use of accessory muscles of respiration.  CARDIOVASCULAR: S1, S2 normal. No  murmurs, rubs, or gallops.  ABDOMEN: Soft, nontender, distended. Bowel sounds present. No organomegaly or mass.  EXTREMITIES: No cyanosis, clubbing or edema b/l.    NEUROLOGIC: Cranial nerves II through XII are intact. No focal Motor or sensory deficits b/l.   PSYCHIATRIC: The patient is alert and oriented x 3.  SKIN: No obvious rash, lesion, or ulcer.   LABORATORY PANEL:   CBC Recent Labs  Lab 01/02/18 0455  WBC 10.9*  HGB 13.0  HCT 37.0*  PLT 264   ------------------------------------------------------------------------------------------------------------------ Chemistries  Recent Labs  Lab 12/31/17 1123  01/01/18 0502  01/03/18 0503  NA  --    < > 116*   < > 124*  K  --    < > 4.8   < > 4.7  CL  --    < > 87*   < > 94*  CO2  --    < > 23   < > 26  GLUCOSE  --    < > 109*   < > 116*  BUN  --    < > 10   < > 6  CREATININE  --    < > 0.64   < > 0.55*  CALCIUM  --    < > 7.5*   < > 7.8*  MG 1.5*  --   --   --   --   AST  --    < > 85*  --   --   ALT  --    < > 41  --   --   ALKPHOS  --    < > 208*  --   --  BILITOT  --    < > 4.2*  --   --    < > = values in this interval not displayed.   ------------------------------------------------------------------------------------------------------------------  Cardiac Enzymes Recent Labs  Lab 12/31/17 1127  TROPONINI <0.03   ------------------------------------------------------------------------------------------------------------------  RADIOLOGY:  US Paracentesis  Result Date: 01/03/2018 CLINICAL DATA:  Abdominal ascites, alcoholic cirrhosis EXAM: ULTRASOUND GUIDED PARACENTESIS TECHNIQUE: The procedure, risks (including but not limited to bleeding, infection, organ damage ), benefits, and alternatives were explained to the patient. Questions regarding the procedure were encouraged and answered. The patient understands and consents to the procedure. Survey ultrasound of the abdomen was performed and an appropriate skin  entry site in the right upper abdomen was selected. Skin site was marked, prepped with chlorhexadine, draped in usual sterile fashion, and infiltrated locally with 1% lidocaine. A Safe-T-Centesis needle was advanced into the peritoneal space until fluid could be aspirated. The sheath was advanced and the needle removed. 5 L of clear yellow ascites were aspirated (maximum requested volume). Samples were sent for the requested laboratory studies. The patient tolerated the procedure well. COMPLICATIONS: COMPLICATIONS none IMPRESSION: Technically successful ultrasound guided paracentesis, removing 5 L ascites. Electronically Signed   By: Lucrezia Europe M.D.   On: 01/03/2018 11:27     ASSESSMENT AND PLAN:   57 year old male patient with history of cirrhosis of liver, coronary artery disease, hypertension, alcohol abuse currently under hospitalist service for hyponatremia and abdominal distention.  1 decompensated liver disease.  Appreciate gastroenterology follow-up No history of chronic hepatitis B or C Has portal hypertension manifested by ascites and thrombocytopenia Ultrasound-guided paracentesis by IR done today 5 liter removed Diuretics will be started after resolution of hyponatremia EGD recommended as outpatient 25 gram albumin infusion today  2.  Hyponatremia improving Fluid restriction Discontinue IV fluids Nephrology consultation Follow-up sodium levels closely Appears hypervolemic hyponatremia with fluid retention  3.  Alcohol abuse Thiamine and folic acid supplements  4. leukocytosis improved  5.DVT prophylaxis Sequential compression device to lower extremities  DC subcu heparin in view of liver disease    All the records are reviewed and case discussed with Care Management/Social Worker. Management plans discussed with the patient, family and they are in agreement.  CODE STATUS: Full  DVT Prophylaxis: SCDs  TOTAL TIME TAKING CARE OF THIS PATIENT: 35 minutes.    POSSIBLE D/C IN 2 to 3 DAYS, DEPENDING ON CLINICAL CONDITION.  Saundra Shelling M.D on 01/03/2018 at 2:26 PM  Between 7am to 6pm - Pager - [phone]  After 6pm go to [email or web address] - password [email or web address] [facility]  Office  [phone]  CC: Primary care physician; Gennaro Africa, MD  Note: This dictation was prepared with Dragon dictation along with smaller phrase technology. Any transcriptional errors that result from this process are unintentional.

## 2018-01-03 NOTE — Evaluation (Signed)
Physical Therapy Evaluation Patient Details Name: Gabriel Gray MRN: [id number] DOB: 09/19/1960 Today's Date: 01/03/2018   History of Present Illness  Pt is a73 y.o.malewith a known history of CAD, hypertension, depression and anxiety. The patient presented the ED with complaints of dizzyness and weakness and almost passing out.The patient denied any loss of consciousness. He complained of diarrhea forabout 1-2 weeks. He denied any fever, chills or abdominal pain, or taking constipation medication. He was found with tachycardia and hyponatremia with sodium level at 115.  Assessment includes: decompensated liver disease, improving hyponatremia, alcohol abuse, and leukocytosis improved.    Clinical Impression  Pt presents with deficits in strength, transfers, mobility, gait, balance, and activity tolerance.  Pt pleasant and conversational but somewhat distracted and required moderate cueing for following commands and for proper sequencing with mobility tasks.  Pt required extensive assistance with bed mobility tasks and once in sitting would occasionally start to drift posteriorly but was able to self-correct.  Pt was able to stand from an elevated EOB witth mod A but once in standing was unsteady with BLE buckling occasionally with pt unable to advance either LE.  Overall pt is very weak functionally and will benefit from PT services in a SNF setting upon discharge to safely address above deficits for decreased caregiver assistance and eventual return to PLOF.      Follow Up Recommendations SNF;Supervision for mobility/OOB    Equipment Recommendations  None recommended by PT    Recommendations for Other Services       Precautions / Restrictions Precautions Precautions: Fall Restrictions Weight Bearing Restrictions: No      Mobility  Bed Mobility Overal bed mobility: Needs Assistance Bed Mobility: Supine to Sit;Sit to Supine     Supine to sit: Mod assist Sit to supine:  Mod assist   General bed mobility comments: Pt slow to process cueing for proper sequencing and required extra time and effort for tasks along with physical assistance  Transfers Overall transfer level: Needs assistance Equipment used: Rolling walker (2 wheeled) Transfers: Sit to/from Stand Sit to Stand: Mod assist         General transfer comment: Mod verbal cues for sequencing for hand placement and increased trunk flexion  Ambulation/Gait             General Gait Details: Pt unable to advance either LE  Stairs            Wheelchair Mobility    Modified Rankin (Stroke Patients Only)       Balance Overall balance assessment: Needs assistance Sitting-balance support: Feet supported;Bilateral upper extremity supported Sitting balance-Leahy Scale: Fair Sitting balance - Comments: Occasional posterior instability but pt able to self-correct   Standing balance support: Bilateral upper extremity supported Standing balance-Leahy Scale: Poor Standing balance comment: Pt unsteady in standing with BLE buckling                             Pertinent Vitals/Pain Pain Assessment: No/denies pain    Home Living Family/patient expects to be discharged to:: Private residence Living Arrangements: Spouse/significant other Available Help at Discharge: Family;Available PRN/intermittently(Wife at work all day) Type of Home: House Home Access: Stairs to enter Entrance Stairs-Rails: Right;Left(Can not reach both) Entrance Stairs-Number of Steps: 3 Home Layout: Two level;Able to live on main level with bedroom/bathroom Home Equipment: Kasandra Knudsen - single point;Walker - 2 wheels      Prior Function Level of Independence: Independent with assistive device(s)  Comments: Mod Ind amb with a SPC with 12 falls in the last year mostly from dizzyness/syncope, Ind with ADLs     Hand Dominance   Dominant Hand: Right    Extremity/Trunk Assessment   Upper  Extremity Assessment Upper Extremity Assessment: Generalized weakness    Lower Extremity Assessment Lower Extremity Assessment: Generalized weakness       Communication   Communication: No difficulties  Cognition Arousal/Alertness: Awake/alert Behavior During Therapy: WFL for tasks assessed/performed Overall Cognitive Status: Within Functional Limits for tasks assessed                                        General Comments      Exercises Total Joint Exercises Ankle Circles/Pumps: AROM;Both;5 reps;10 reps Quad Sets: Strengthening;5 reps;10 reps;Both Gluteal Sets: Strengthening;5 reps;Both;10 reps Heel Slides: AROM;Both;5 reps Hip ABduction/ADduction: AROM;Both;5 reps Straight Leg Raises: AROM;Both;5 reps Long Arc Quad: AROM;Both;10 reps Knee Flexion: AROM;Both;10 reps Other Exercises Other Exercises: HEP education for BLE QS, GS, and APs x 10 each 5-6x/day   Assessment/Plan    PT Assessment Patient needs continued PT services  PT Problem List Decreased strength;Decreased activity tolerance;Decreased balance;Decreased knowledge of use of DME;Decreased mobility       PT Treatment Interventions DME instruction;Gait training;Stair training;Functional mobility training;Balance training;Therapeutic exercise;Therapeutic activities;Patient/family education    PT Goals (Current goals can be found in the Care Plan section)  Acute Rehab PT Goals Patient Stated Goal: To get stronger again PT Goal Formulation: With patient Time For Goal Achievement: 01/16/18 Potential to Achieve Goals: Good    Frequency Min 2X/week   Barriers to discharge Inaccessible home environment;Decreased caregiver support      Co-evaluation               AM-PAC PT "6 Clicks" Daily Activity  Outcome Measure Difficulty turning over in bed (including adjusting bedclothes, sheets and blankets)?: Unable Difficulty moving from lying on back to sitting on the side of the bed? :  Unable Difficulty sitting down on and standing up from a chair with arms (e.g., wheelchair, bedside commode, etc,.)?: Unable Help needed moving to and from a bed to chair (including a wheelchair)?: Total Help needed walking in hospital room?: Total Help needed climbing 3-5 steps with a railing? : Total 6 Click Score: 6    End of Session Equipment Utilized During Treatment: Gait belt Activity Tolerance: Patient limited by fatigue Patient left: in bed;with bed alarm set;with call bell/phone within reach Nurse Communication: Mobility status;Other (comment)(Pt c/o ingrown toe nail) PT Visit Diagnosis: Unsteadiness on feet (R26.81);Repeated falls (R29.6);Difficulty in walking, not elsewhere classified (R26.2);Muscle weakness (generalized) (M62.81)    Time: 7209-4709 PT Time Calculation (min) (ACUTE ONLY): 30 min   Charges:   PT Evaluation $PT Eval Low Complexity: 1 Low PT Treatments $Therapeutic Exercise: 8-22 mins   PT G Codes:        DRoyetta Asal PT, DPT 01/03/18, 10:17 AM

## 2018-01-03 NOTE — Progress Notes (Signed)
   Gabriel Gray , MD [address], [place], [place], [place], [address] [address], [place], [place], [place], [address] Phone: [phone]  Fax: [phone]   Gabriel Gray is being followed for decompensated cirrhosis    Subjective: Doing well , no diarrhea which he had for 4 days and was severe    Objective: Vital signs in last 24 hours: Vitals:   01/02/18 0519 01/02/18 1407 01/02/18 1949 01/03/18 0525  BP: 114/82 100/68 113/79 96/71  Pulse: 99 96 (!) 101 100  Resp: 19 18 19 19   Temp: (!) 97.4 F (36.3 C) (!) 97.1 F (36.2 C) 98.2 F (36.8 C) 98.1 F (36.7 C)  TempSrc: Oral Oral Oral Oral  SpO2: 97% 96% 97% 95%  Weight:      Height:       Weight change:   Intake/Output Summary (Last 24 hours) at 01/03/2018 0759 Last data filed at 01/03/2018 0530 Gross per 24 hour  Intake -  Output 1220 ml  Net -1220 ml     Exam: Heart:: Regular rate and rhythm, S1S2 present or without murmur or extra heart sounds Lungs: normal, clear to auscultation and clear to auscultation and percussion Abdomen: soft, nontender, normal bowel sounds   Lab Results: @LABTEST2 @ Micro Results: No results found for this or any previous visit (from the past 240 hour(s)). Studies/Results: No results found. Medications: I have reviewed the patient's current medications. Scheduled Meds: . aspirin EC  81 mg Oral Daily  . buPROPion  300 mg Oral Daily  . folic acid  1 mg Oral Daily  . levothyroxine  50 mcg Oral QAC breakfast  . multivitamin with minerals  1 tablet Oral Daily  . rifaximin  550 mg Oral BID  . thiamine  100 mg Oral Daily   Or  . thiamine  100 mg Intravenous Daily  . traZODone  100 mg Oral QHS   Continuous Infusions: PRN Meds:.acetaminophen **OR** acetaminophen, albuterol, bisacodyl, HYDROcodone-acetaminophen, lip balm, LORazepam **OR** LORazepam, ondansetron **OR** ondansetron (ZOFRAN) IV, senna-docusate  BP 96/71 (BP Location: Right Arm)   Pulse 100   Temp 98.1  F (36.7 C) (Oral)   Resp 19   Ht 6' (1.829 m)   Wt 218 lb 14.4 oz (99.3 kg)   SpO2 95%   BMI 29.69 kg/m   Assessment: Active Problems:   Hyponatremia  Gabriel Gray 57 y.o. male admitted on 12/31/17 with a syncopal episode , found to have severe hyponatremia. H/o decompensated alcoholic cirrhosis , cerebellar ataxia , still actively drinking during the weekends. He has a hypochloremic hyponatremia with relatively elevated creatinine suggesting of hypovolemic hyponatremia which would generally respond to rehydration. Syncope may be related to hypovolemia which in turn likely secondary to recent episode of diarrhea which has since resolved.   Plan: 1. Low salt diet  2. Stop all alcohol  3. Nephrology consulted for  low sodium  4. Outpatient evaluation of TIPS if cannot tolerate diuretics 5. Encephalopathy likely from low electrolytes- continue lactulose titrating to two soft bowel movements, if has diarrhea decrease dose of lactulose no imodium . Dehydration can worsen encephalopathy. Continue Xifaxan  6. EGD as an outpatient to evaluate for varices screening 7. Diagnostic abdominal paracentesis.      LOS: 3 days   Gabriel Bellows, MD 01/03/2018, 7:59 AM

## 2018-01-03 NOTE — Clinical Social Work Note (Signed)
Clinical Social Work Assessment  Patient Details  Name: Gabriel Gray MRN: [id number] Date of Birth: November 11, 1960  Date of referral:  01/03/18               Reason for consult:  Facility Placement                Permission sought to share information with:  Chartered certified accountant granted to share information::  Yes, Verbal Permission Granted  Name::      [facility]::   [facility]   Relationship::     Contact Information:     Housing/Transportation Living arrangements for the past 2 months:  [place] of Information:  Patient Patient Interpreter Needed:  None Criminal Activity/Legal Involvement Pertinent to Current Situation/Hospitalization:  No - Comment as needed Significant Relationships:  Spouse Lives with:  Spouse Do you feel safe going back to the place where you live?  Yes Need for family participation in patient care:  Yes (Comment)  Care giving concerns: Patient lives in [place] with his wife [facility].    Social Worker assessment / plan: Holiday representative (CSW) reviewed chart and noted that PT is recommending SNF. CSW met with patient alone at bedside to discuss D/C plan. Patient was alert and oriented X4 and was laying in the bed. CSW introduced self and explained role of CSW department. Patient reported that she lives in [place] with his wife and has cut down on drinking alcohol. CSW explained SNF process and that [facility] will have to approve it. Patient is agreeable to SNF search in [place]. FL2 complete and faxed out.   CSW presented bed offers to patient and he chose [facility]. Per [facility] admissions coordinator at [facility] she will start [facility] authorization today. CSW will continue to follow and assist as needed.   Employment status:  Disabled (Comment on whether or not currently receiving Disability) Insurance information:  Managed Care PT Recommendations:  [facility] / Referral to community resources:  [facility]  Patient/Family's Response to care: Patient is agreeable to D/C to [facility].   Patient/Family's Understanding of and Emotional Response to Diagnosis, Current Treatment, and Prognosis: Patient was very pleasant and thanked CSW for assistance.   Emotional Assessment Appearance:  Appears stated age Attitude/Demeanor/Rapport:    Affect (typically observed):  Accepting, Adaptable, Pleasant Orientation:  Oriented to Self, Oriented to Place, Oriented to  Time, Oriented to Situation Alcohol / Substance use:  Not Applicable Psych involvement (Current and /or in the community):  No (Comment)  Discharge Needs  Concerns to be addressed:  Discharge Planning Concerns Readmission within the last 30 days:  No Current discharge risk:  Dependent with Mobility Barriers to Discharge:  Continued Medical Work up   [employer], Gabriel Beets, LCSW 01/03/2018, 6:47 PM

## 2018-01-04 LAB — BASIC METABOLIC PANEL
ANION GAP: 6 (ref 5–15)
BUN: 5 mg/dL — ABNORMAL LOW (ref 6–20)
CALCIUM: 8 mg/dL — AB (ref 8.9–10.3)
CO2: 24 mmol/L (ref 22–32)
CREATININE: 0.51 mg/dL — AB (ref 0.61–1.24)
Chloride: 97 mmol/L — ABNORMAL LOW (ref 101–111)
Glucose, Bld: 110 mg/dL — ABNORMAL HIGH (ref 65–99)
Potassium: 4.6 mmol/L (ref 3.5–5.1)
Sodium: 127 mmol/L — ABNORMAL LOW (ref 135–145)

## 2018-01-04 LAB — ACID FAST SMEAR (AFB, MYCOBACTERIA): Acid Fast Smear: NEGATIVE

## 2018-01-04 LAB — PROTEIN, BODY FLUID (OTHER): TOTAL PROTEIN, BODY FLUID OTHER: 0.8 g/dL

## 2018-01-04 LAB — VITAMIN B1: Vitamin B1 (Thiamine): 116.4 nmol/L (ref 66.5–200.0)

## 2018-01-04 LAB — CYTOLOGY - NON PAP

## 2018-01-04 MED ORDER — SPIRONOLACTONE 25 MG PO TABS
25.0000 mg | ORAL_TABLET | Freq: Every day | ORAL | Status: DC
  Administered 2018-01-04 – 2018-01-05 (×2): 25 mg via ORAL
  Filled 2018-01-04 (×2): qty 1

## 2018-01-04 MED ORDER — FUROSEMIDE 20 MG PO TABS
20.0000 mg | ORAL_TABLET | Freq: Every day | ORAL | Status: DC
  Administered 2018-01-04 – 2018-01-05 (×2): 20 mg via ORAL
  Filled 2018-01-04 (×2): qty 1

## 2018-01-04 NOTE — Progress Notes (Signed)
Physical Therapy Treatment Patient Details Name: [facility] MRN: [id number] DOB: [birth year] Today's Date: 01/04/2018    History of Present Illness Pt is a56 y.o.malewith a known history of CAD, hypertension, depression and anxiety. The patient presented the ED with complaints of dizzyness and weakness and almost passing out.The patient denied any loss of consciousness. He complained of diarrhea forabout 1-2 weeks. He denied any fever, chills or abdominal pain, or taking constipation medication. He was found with tachycardia and hyponatremia with sodium level at 115.  Assessment includes: decompensated liver disease, improving hyponatremia, alcohol abuse, and leukocytosis improved.    PT Comments    Pt was difficult to keep on task and needed frequent redirection but he was willing and eager to do what he could.  He needed frequent brief rest breaks to "recharge" after minimal efforts with mobility or exercises. He was very fearful of falling t/o the session and did have buckling and UE tremoring with standing activity, though he ultimately did show some improvement in mobility/function despite continued inability to actually ambulate.     Follow Up Recommendations  SNF;Supervision for mobility/OOB     Equipment Recommendations       Recommendations for Other Services       Precautions / Restrictions Precautions Precautions: Fall Restrictions Weight Bearing Restrictions: No    Mobility  Bed Mobility Overal bed mobility: Needs Assistance Bed Mobility: Supine to Sit;Sit to Supine     Supine to sit: Min assist Sit to supine: Min assist   General bed mobility comments: Pt again very slow to piece together the requisite movements to get to upright sitting, light HHA to get to sitting, light assist to shift LEs back into bed  Transfers Overall transfer level: Needs assistance Equipment used: Rolling walker (2 wheeled) Transfers: Sit to/from Stand Sit to Stand: Mod  assist         General transfer comment: Pt showed good effort but needed considerable assist (and elevated bed height) to rise to fully upright standing.  Some knee buckling in getting to upright.  Ambulation/Gait             General Gait Details: Unable to do any true ambulation, but he did take 2 side stuffle steps and manange to shift weight onto R LE enough to slide L foot forward ~2"   Stairs             Wheelchair Mobility    Modified Rankin (Stroke Patients Only)       Balance Overall balance assessment: Needs assistance   Sitting balance-Leahy Scale: Good       Standing balance-Leahy Scale: Poor Standing balance comment: heavily reliant on UEs on walker, U&LEs tentatively able to maintain fully upright                            Cognition Arousal/Alertness: Awake/alert Behavior During Therapy: WFL for tasks assessed/performed Overall Cognitive Status: Within Functional Limits for tasks assessed                                 General Comments: Pt difficult to keep on task needing consistent redirection      Exercises General Exercises - Lower Extremity Ankle Circles/Pumps: AROM;10 reps(pt reports that he has regularly been doing ankle pumps) Heel Slides: Strengthening;10 reps(including resisted leg extension) Hip ABduction/ADduction: Strengthening;10 reps    General Comments  Pertinent Vitals/Pain Pain Assessment: 0-10 Pain Score: 3 (increased with general activity, etc) Pain Location: head ache, L great toe hang nail, L cubital IV site, b/l forearm soreness    Home Living                      Prior Function            PT Goals (current goals can now be found in the care plan section) Progress towards PT goals: Progressing toward goals    Frequency    Min 2X/week      PT Plan Current plan remains appropriate    Co-evaluation              AM-PAC PT "6 Clicks" Daily Activity   Outcome Measure  Difficulty turning over in bed (including adjusting bedclothes, sheets and blankets)?: Unable Difficulty moving from lying on back to sitting on the side of the bed? : Unable Difficulty sitting down on and standing up from a chair with arms (e.g., wheelchair, bedside commode, etc,.)?: Unable Help needed moving to and from a bed to chair (including a wheelchair)?: Total Help needed walking in hospital room?: Total Help needed climbing 3-5 steps with a railing? : Total 6 Click Score: 6    End of Session Equipment Utilized During Treatment: Gait belt Activity Tolerance: Patient limited by fatigue Patient left: with bed alarm set;with call bell/phone within reach   PT Visit Diagnosis: Unsteadiness on feet (R26.81);Repeated falls (R29.6);Difficulty in walking, not elsewhere classified (R26.2);Muscle weakness (generalized) (M62.81)     Time: 6440-3474 PT Time Calculation (min) (ACUTE ONLY): 40 min  Charges:  $Therapeutic Exercise: 8-22 mins $Therapeutic Activity: 23-37 mins                    G Codes:       Kreg Shropshire, DPT 01/04/2018, 5:36 PM

## 2018-01-04 NOTE — Progress Notes (Signed)
[facility]  ROUNDING NOTE   Subjective:   Na 127  Objective:  Vital signs in last 24 hours:  Temp:  [97.5 F (36.4 C)-99.2 F (37.3 C)] 99.2 F (37.3 C) (04/23 0432) Pulse Rate:  [89-110] 107 (04/23 0433) Resp:  [18] 18 (04/23 0432) BP: (93-113)/(63-82) 113/82 (04/23 0432) SpO2:  [88 %-97 %] 95 % (04/23 0433)  Weight change:  Filed Weights   12/31/17 1741  Weight: 99.3 kg (218 lb 14.4 oz)    Intake/Output: I/O last 3 completed shifts: In: 0  Out: 1795 [[id number]]   Intake/Output this shift:  No intake/output data recorded.  Physical Exam: General: NAD,   Head: Normocephalic, atraumatic. Moist oral mucosal membranes  Eyes: Anicteric, PERRL  Neck: Supple, trachea midline  Lungs:  Clear to auscultation  Heart: Regular rate and rhythm  Abdomen:  Soft, nontender,   Extremities:  no peripheral edema.  Neurologic: Nonfocal, moving all four extremities  Skin: No lesions        Basic Metabolic Panel: Recent Labs  Lab 12/31/17 1123  12/31/17 1127 01/01/18 0502 01/01/18 1618 01/02/18 0455 01/03/18 0503 01/04/18 0509  NA  --    < > 115* 116* 118* 122* 124* 127*  K  --   --  5.2* 4.8  --  4.3 4.7 4.6  CL  --   --  79* 87*  --  90* 94* 97*  CO2  --   --  25 23  --  25 26 24   GLUCOSE  --   --  127* 109*  --  117* 116* 110*  BUN  --   --  7 10  --  7 6 5*  CREATININE  --   --  0.74 0.64  --  0.58* 0.55* 0.51*  CALCIUM  --    < > 8.5* 7.5*  --  8.1* 7.8* 8.0*  MG 1.5*  --   --   --   --   --   --   --    < > = values in this interval not displayed.    Liver Function Tests: Recent Labs  Lab 12/31/17 1127 01/01/18 0502  AST 140* 85*  ALT 62 41  ALKPHOS 287* 208*  BILITOT 4.7* 4.2*  PROT 6.3* 4.6*  ALBUMIN 3.0* 2.2*   No results for input(s): LIPASE, AMYLASE in the last 168 hours. Recent Labs  Lab 12/31/17 1123  AMMONIA 17    CBC: Recent Labs  Lab 12/31/17 1127 01/01/18 0502 01/02/18 0455  WBC 21.0* 14.5* 10.9*  HGB 14.4 11.8*  13.0  HCT 41.7 33.7* 37.0*  MCV 101.0* 102.8* 103.4*  PLT 364 257 264    Cardiac Enzymes: Recent Labs  Lab 12/31/17 1127  TROPONINI <0.03    BNP: Invalid input(s): POCBNP  CBG: No results for input(s): GLUCAP in the last 168 hours.  Microbiology: Results for orders placed or performed during the hospital encounter of 12/31/17  Acid Fast Smear (AFB)     Status: None   Collection Time: 01/03/18 11:09 AM  Result Value Ref Range Status   AFB Specimen Processing Concentration  Final   Acid Fast Smear Negative  Final    Comment: (NOTE) Performed At: [facility] [place], [place] [id number] Rush Farmer MD [id number]    Source (AFB) PERITONEAL  Final    Comment: Performed at [facility], [address] [place], [place] [address]  Body fluid culture     Status: None (Preliminary  result)   Collection Time: 01/03/18 11:09 AM  Result Value Ref Range Status   Specimen Description   Final    PERITONEAL Performed at [facility], [address] [place], [place] [address]    Special Requests   Final    PERITONEAL Performed at [facility], [place], [place] [address]    Gram Stain NO WBC SEEN NO ORGANISMS SEEN   Final   Culture   Final    NO GROWTH < 24 HOURS Performed at [facility], [place] [address] [place],  [address]    Report Status PENDING  Incomplete    Coagulation Studies: No results for input(s): LABPROT, INR in the last 72 hours.  Urinalysis: No results for input(s): COLORURINE, LABSPEC, PHURINE, GLUCOSEU, HGBUR, BILIRUBINUR, KETONESUR, PROTEINUR, UROBILINOGEN, NITRITE, LEUKOCYTESUR in the last 72 hours.  Invalid input(s): APPERANCEUR    Imaging: US Paracentesis  Result Date: 01/03/2018 CLINICAL DATA:  Abdominal ascites, alcoholic cirrhosis EXAM: ULTRASOUND GUIDED PARACENTESIS TECHNIQUE: The procedure, risks (including but not limited to bleeding, infection, organ  damage ), benefits, and alternatives were explained to the patient. Questions regarding the procedure were encouraged and answered. The patient understands and consents to the procedure. Survey ultrasound of the abdomen was performed and an appropriate skin entry site in the right upper abdomen was selected. Skin site was marked, prepped with chlorhexadine, draped in usual sterile fashion, and infiltrated locally with 1% lidocaine. A Safe-T-Centesis needle was advanced into the peritoneal space until fluid could be aspirated. The sheath was advanced and the needle removed. 5 L of clear yellow ascites were aspirated (maximum requested volume). Samples were sent for the requested laboratory studies. The patient tolerated the procedure well. COMPLICATIONS: COMPLICATIONS none IMPRESSION: Technically successful ultrasound guided paracentesis, removing 5 L ascites. Electronically Signed   By: Lucrezia Europe M.D.   On: 01/03/2018 11:27     Medications:    . aspirin EC  81 mg Oral Daily  . buPROPion  300 mg Oral Daily  . folic acid  1 mg Oral Daily  . furosemide  20 mg Oral Daily  . levothyroxine  50 mcg Oral QAC breakfast  . multivitamin with minerals  1 tablet Oral Daily  . rifaximin  550 mg Oral BID  . spironolactone  25 mg Oral Daily  . thiamine  100 mg Oral Daily   Or  . thiamine  100 mg Intravenous Daily  . traZODone  100 mg Oral QHS   acetaminophen **OR** acetaminophen, albuterol, bisacodyl, HYDROcodone-acetaminophen, lip balm, ondansetron **OR** ondansetron (ZOFRAN) IV, senna-docusate  Assessment/ Plan:  Mr. [facility] is a 57 y.o. white male with hepatic cirrhosis, alcohol abuse, hypertension, depression, anxiety and coronary artery disease, who was admitted to [facility] on 12/31/2017 for Hyponatremia [E87.1]  1. Hyponatremia 2. Hepatic Cirrhosis 3. Alcohol abuse 4. Ascites: status post large volume paracentesis on 4/22.  5. Hyperkalemia on admission  Plan: Continue fluid  restriction Start furosemide PO daily.      LOS: 4 Amarionna Arca 4/23/20191:52 PM

## 2018-01-04 NOTE — Plan of Care (Signed)
  Problem: Education: Goal: Knowledge of General Education information will improve 01/04/2018 0451 by Vergie Living, RN Outcome: Progressing 01/04/2018 0450 by Jannifer Rodney A, RN Outcome: Progressing   Problem: Health Behavior/Discharge Planning: Goal: Ability to manage health-related needs will improve 01/04/2018 0451 by Jannifer Rodney A, RN Outcome: Progressing 01/04/2018 0450 by Jannifer Rodney A, RN Outcome: Progressing   Problem: Clinical Measurements: Goal: Ability to maintain clinical measurements within normal limits will improve 01/04/2018 0451 by Jannifer Rodney A, RN Outcome: Progressing 01/04/2018 0450 by Jannifer Rodney A, RN Outcome: Progressing Goal: Will remain free from infection 01/04/2018 0451 by Jannifer Rodney A, RN Outcome: Progressing 01/04/2018 0450 by Jannifer Rodney A, RN Outcome: Progressing Goal: Diagnostic test results will improve 01/04/2018 0451 by Jannifer Rodney A, RN Outcome: Progressing 01/04/2018 0450 by Jannifer Rodney A, RN Outcome: Progressing Goal: Respiratory complications will improve 01/04/2018 0451 by Jannifer Rodney A, RN Outcome: Progressing 01/04/2018 0450 by Jannifer Rodney A, RN Outcome: Progressing Goal: Cardiovascular complication will be avoided 01/04/2018 0451 by Jannifer Rodney A, RN Outcome: Progressing 01/04/2018 0450 by Jannifer Rodney A, RN Outcome: Progressing   Problem: Activity: Goal: Risk for activity intolerance will decrease 01/04/2018 0451 by Jannifer Rodney A, RN Outcome: Progressing 01/04/2018 0450 by Jannifer Rodney A, RN Outcome: Progressing   Problem: Nutrition: Goal: Adequate nutrition will be maintained 01/04/2018 0451 by Jannifer Rodney A, RN Outcome: Progressing 01/04/2018 0450 by Jannifer Rodney A, RN Outcome: Progressing   Problem: Coping: Goal: Level of anxiety will decrease 01/04/2018 0451 by Jannifer Rodney A, RN Outcome: Progressing 01/04/2018 0450 by Jannifer Rodney A, RN Outcome: Progressing   Problem:  Elimination: Goal: Will not experience complications related to bowel motility 01/04/2018 0451 by Jannifer Rodney A, RN Outcome: Progressing 01/04/2018 0450 by Jannifer Rodney A, RN Outcome: Progressing Goal: Will not experience complications related to urinary retention 01/04/2018 0451 by Vergie Living, RN Outcome: Progressing 01/04/2018 0450 by Jannifer Rodney A, RN Outcome: Progressing   Problem: Pain Managment: Goal: General experience of comfort will improve 01/04/2018 0451 by Jannifer Rodney A, RN Outcome: Progressing 01/04/2018 0450 by Jannifer Rodney A, RN Outcome: Progressing   Problem: Safety: Goal: Ability to remain free from injury will improve 01/04/2018 0451 by Jannifer Rodney A, RN Outcome: Progressing 01/04/2018 0450 by Jannifer Rodney A, RN Outcome: Progressing   Problem: Skin Integrity: Goal: Risk for impaired skin integrity will decrease 01/04/2018 0451 by Jannifer Rodney A, RN Outcome: Progressing 01/04/2018 0450 by Vergie Living, RN Outcome: Progressing

## 2018-01-04 NOTE — Progress Notes (Signed)
Gabriel Gray , MD [address], [place], [place], [place], [address] [address] [place], [place], [place], [address] Phone: [phone]  Fax: [phone]   Gabriel Gray is being followed for hepatic encephelopathy   Subjective: Feels well , sitting up in his bed.    Objective: Vital signs in last 24 hours: Vitals:   01/03/18 1222 01/03/18 1949 01/04/18 0432 01/04/18 0433  BP: (!) 89/65 93/63 113/82   Pulse: 91 89 (!) 110 (!) 107  Resp: 16 18 18    Temp: 98.5 F (36.9 C) (!) 97.5 F (36.4 C) 99.2 F (37.3 C)   TempSrc: Oral Oral Oral   SpO2: 100% 97% (!) 88% 95%  Weight:      Height:       Weight change:   Intake/Output Summary (Last 24 hours) at 01/04/2018 6948 Last data filed at 01/03/2018 2300 Gross per 24 hour  Intake 0 ml  Output 900 ml  Net -900 ml     Exam: Heart:: Regular rate and rhythm, S1S2 present or without murmur or extra heart sounds Lungs: normal, clear to auscultation and clear to auscultation and percussion Abdomen: soft, nontender, normal bowel sounds   Lab Results: @LABTEST2 @ Micro Results: Recent Results (from the past 240 hour(s))  Acid Fast Smear (AFB)     Status: None   Collection Time: 01/03/18 11:09 AM  Result Value Ref Range Status   AFB Specimen Processing Concentration  Final   Acid Fast Smear Negative  Final    Comment: (NOTE) Performed At: [facility] [address] [place], [place] [id number] Rush Farmer MD [id number]    Source (AFB) PERITONEAL  Final    Comment: Performed at [facility], [address] [place], [place] [address]  Body fluid culture     Status: None (Preliminary result)   Collection Time: 01/03/18 11:09 AM  Result Value Ref Range Status   Specimen Description   Final    PERITONEAL Performed at [facility], [address] [place], [place] [address]    Special Requests   Final    PERITONEAL Performed at [facility], [address]  [place], [place] [address]    Gram Stain   Final    NO WBC SEEN NO ORGANISMS SEEN Performed at [facility], [place] [address], [place], [place] [address]    Culture PENDING  Incomplete   Report Status PENDING  Incomplete   Studies/Results: US Paracentesis  Result Date: 01/03/2018 CLINICAL DATA:  Abdominal ascites, alcoholic cirrhosis EXAM: ULTRASOUND GUIDED PARACENTESIS TECHNIQUE: The procedure, risks (including but not limited to bleeding, infection, organ damage ), benefits, and alternatives were explained to the patient. Questions regarding the procedure were encouraged and answered. The patient understands and consents to the procedure. Survey ultrasound of the abdomen was performed and an appropriate skin entry site in the right upper abdomen was selected. Skin site was marked, prepped with chlorhexadine, draped in usual sterile fashion, and infiltrated locally with 1% lidocaine. A Safe-T-Centesis needle was advanced into the peritoneal space until fluid could be aspirated. The sheath was advanced and the needle removed. 5 L of clear yellow ascites were aspirated (maximum requested volume). Samples were sent for the requested laboratory studies. The patient tolerated the procedure well. COMPLICATIONS: COMPLICATIONS none IMPRESSION: Technically successful ultrasound guided paracentesis, removing 5 L ascites. Electronically Signed   By: Lucrezia Europe M.D.   On: 01/03/2018 11:27   Medications: I have reviewed the patient's current medications. Scheduled  Meds: . aspirin EC  81 mg Oral Daily  . buPROPion  300 mg Oral Daily  . folic acid  1 mg Oral Daily  . levothyroxine  50 mcg Oral QAC breakfast  . multivitamin with minerals  1 tablet Oral Daily  . rifaximin  550 mg Oral BID  . thiamine  100 mg Oral Daily   Or  . thiamine  100 mg Intravenous Daily  . traZODone  100 mg Oral QHS   Continuous Infusions: PRN Meds:.acetaminophen **OR** acetaminophen, albuterol, bisacodyl,  HYDROcodone-acetaminophen, lip balm, ondansetron **OR** ondansetron (ZOFRAN) IV, senna-docusate   Assessment: Active Problems:   Hyponatremia  Gabriel Gray 57 y.o. male admitted on 12/31/17 with a syncopal episode , found to have severe hyponatremia. H/o decompensated alcoholic cirrhosis , cerebellar ataxia , still actively drinking during the weekends. He has a hypochloremic hyponatremia with relatively elevated creatinine suggesting of hypovolemic hyponatremia which would generally respond to rehydration. Syncope may be related to hypovolemia which in turn likely secondary to recent episode of diarrhea which has since resolved. Abdominal paracentesis - no SBP- clinically feels back to baseline .   Plan: 1. Low salt diet  2. Stop all alcohol  3.Outpatient evaluation of TIPS if cannot tolerate diuretics 5. Encephalopathy likely from low electrolytes, dehydration - continue lactulose titrating to two soft bowel movements, if has diarrhea decrease dose of lactulose no imodium . Dehydration can worsen encephalopathy. Continue Xifaxan  6. EGD as an outpatient to evaluate for varices screening 7. Commence on lasix 20 mg and aldactone 50 mg close to discharge 8. F/u in GI clinic.   I will sign off.  Please call me if any further GI concerns or questions.  We would like to thank you for the opportunity to participate in the care of [employer].     LOS: 4 days   Gabriel Bellows, MD 01/04/2018, 9:51 AM

## 2018-01-04 NOTE — Progress Notes (Signed)
[place] [facility] [place] NAME: Gabriel Gray    MR#:  [id number]  DATE OF BIRTH:  May 30, 1961  SUBJECTIVE:  Patient seen and evaluated by me today  No chest pain No shortness of breath  abdominal pain better Had Paracentesis yesterday  REVIEW OF SYSTEMS:    ROS  CONSTITUTIONAL: No documented fever. Has fatigue, weakness. No weight gain, no weight loss.  EYES: No blurry or double vision.  ENT: No tinnitus. No postnasal drip. No redness of the oropharynx.  RESPIRATORY: No cough, no wheeze, no hemoptysis. No dyspnea.  CARDIOVASCULAR: No chest pain. No orthopnea. No palpitations. No syncope.  GASTROINTESTINAL: No nausea, no vomiting or diarrhea. No abdominal pain. No melena or hematochezia.  GENITOURINARY: No dysuria or hematuria.  ENDOCRINE: No polyuria or nocturia. No heat or cold intolerance.  HEMATOLOGY: No anemia. No bruising. No bleeding.  INTEGUMENTARY: No rashes. No lesions.  MUSCULOSKELETAL: No arthritis. No swelling. No gout.  NEUROLOGIC: No numbness, tingling, or ataxia. No seizure-type activity.  PSYCHIATRIC: No anxiety. No insomnia. No ADD.   DRUG ALLERGIES:  No Known Allergies  VITALS:  Blood pressure 113/82, pulse (!) 107, temperature 99.2 F (37.3 C), temperature source Oral, resp. rate 18, height 6' (1.829 m), weight 99.3 kg (218 lb 14.4 oz), SpO2 95 %.  PHYSICAL EXAMINATION:   Physical Exam  GENERAL:  57 y.o.-year-old patient lying in the bed with no acute distress.  EYES: Pupils equal, round, reactive to light and accommodation. No scleral icterus. Extraocular muscles intact.  HEENT: Head atraumatic, normocephalic. Oropharynx and nasopharynx clear.  NECK:  Supple, no jugular venous distention. No thyroid enlargement, no tenderness.  LUNGS: Normal breath sounds bilaterally, no wheezing, rales, rhonchi. No use of accessory muscles of respiration.  CARDIOVASCULAR: S1, S2 normal. No murmurs, rubs, or gallops.  ABDOMEN: Soft,  nontender, abdomen less distended. Bowel sounds present. No organomegaly or mass.  EXTREMITIES: No cyanosis, clubbing or edema b/l.    NEUROLOGIC: Cranial nerves II through XII are intact. No focal Motor or sensory deficits b/l.   PSYCHIATRIC: The patient is alert and oriented x 3.  SKIN: No obvious rash, lesion, or ulcer.   LABORATORY PANEL:   CBC Recent Labs  Lab 01/02/18 0455  WBC 10.9*  HGB 13.0  HCT 37.0*  PLT 264   ------------------------------------------------------------------------------------------------------------------ Chemistries  Recent Labs  Lab 12/31/17 1123  01/01/18 0502  01/04/18 0509  NA  --    < > 116*   < > 127*  K  --    < > 4.8   < > 4.6  CL  --    < > 87*   < > 97*  CO2  --    < > 23   < > 24  GLUCOSE  --    < > 109*   < > 110*  BUN  --    < > 10   < > 5*  CREATININE  --    < > 0.64   < > 0.51*  CALCIUM  --    < > 7.5*   < > 8.0*  MG 1.5*  --   --   --   --   AST  --    < > 85*  --   --   ALT  --    < > 41  --   --   ALKPHOS  --    < > 208*  --   --   BILITOT  --    < >  4.2*  --   --    < > = values in this interval not displayed.   ------------------------------------------------------------------------------------------------------------------  Cardiac Enzymes Recent Labs  Lab 12/31/17 1127  TROPONINI <0.03   ------------------------------------------------------------------------------------------------------------------  RADIOLOGY:  US Paracentesis  Result Date: 01/03/2018 CLINICAL DATA:  Abdominal ascites, alcoholic cirrhosis EXAM: ULTRASOUND GUIDED PARACENTESIS TECHNIQUE: The procedure, risks (including but not limited to bleeding, infection, organ damage ), benefits, and alternatives were explained to the patient. Questions regarding the procedure were encouraged and answered. The patient understands and consents to the procedure. Survey ultrasound of the abdomen was performed and an appropriate skin entry site in the right upper  abdomen was selected. Skin site was marked, prepped with chlorhexadine, draped in usual sterile fashion, and infiltrated locally with 1% lidocaine. A Safe-T-Centesis needle was advanced into the peritoneal space until fluid could be aspirated. The sheath was advanced and the needle removed. 5 L of clear yellow ascites were aspirated (maximum requested volume). Samples were sent for the requested laboratory studies. The patient tolerated the procedure well. COMPLICATIONS: COMPLICATIONS none IMPRESSION: Technically successful ultrasound guided paracentesis, removing 5 L ascites. Electronically Signed   By: Lucrezia Europe M.D.   On: 01/03/2018 11:27     ASSESSMENT AND PLAN:   57 year old male patient with history of cirrhosis of liver, coronary artery disease, hypertension, alcohol abuse currently under hospitalist service for hyponatremia and abdominal distention.  1 decompensated liver disease.  Appreciate gastroenterology follow-up No history of chronic hepatitis B or C Has portal hypertension manifested by ascites and thrombocytopenia S/p Ultrasound-guided paracentesis by IR done yesterday 5 liter removed Lasix and aldactone will be started today EGD recommended as outpatient for variceal screening  2.  Hyponatremia improved Fluid restriction Nephrology f/u appreciated Follow-up sodium levels closely Appears hypervolemic hyponatremia with fluid retention  3.  Alcohol abuse Thiamine and folic acid supplements Alcohol cessation counseled to patient  4. leukocytosis improved  5.DVT prophylaxis Sequential compression device to lower extremities     All the records are reviewed and case discussed with Care Management/Social Worker. Management plans discussed with the patient, family and they are in agreement.  CODE STATUS: Full  DVT Prophylaxis: SCDs  TOTAL TIME TAKING CARE OF THIS PATIENT: 35 minutes.   POSSIBLE D/C IN 2 to 3 DAYS, DEPENDING ON CLINICAL CONDITION.  Saundra Shelling  M.D on 01/04/2018 at 1:12 PM  Between 7am to 6pm - Pager - [phone]  After 6pm go to [email or web address] - password [email or web address] [facility]  Office  [phone]  CC: Primary care physician; Gennaro Africa, MD  Note: This dictation was prepared with Dragon dictation along with smaller phrase technology. Any transcriptional errors that result from this process are unintentional.

## 2018-01-05 DIAGNOSIS — R188 Other ascites: Secondary | ICD-10-CM

## 2018-01-05 LAB — SODIUM: SODIUM: 129 mmol/L — AB (ref 135–145)

## 2018-01-05 MED ORDER — ADULT MULTIVITAMIN W/MINERALS CH: 1.0000 | ORAL_TABLET | Freq: Every day | ORAL | Status: DC

## 2018-01-05 MED ORDER — FUROSEMIDE 20 MG PO TABS: 20.0000 mg | ORAL_TABLET | Freq: Every day | ORAL | 0 refills | Status: DC

## 2018-01-05 MED ORDER — FOLIC ACID 1 MG PO TABS: 1.0000 mg | ORAL_TABLET | Freq: Every day | ORAL | Status: DC

## 2018-01-05 MED ORDER — SPIRONOLACTONE 50 MG PO TABS: 50.0000 mg | ORAL_TABLET | Freq: Every day | ORAL | 0 refills | Status: DC

## 2018-01-05 MED ORDER — THIAMINE HCL 100 MG/ML IJ SOLN
100.0000 mg | Freq: Every day | INTRAMUSCULAR | Status: DC
  Filled 2018-01-05: qty 1

## 2018-01-05 MED ORDER — SPIRONOLACTONE 25 MG PO TABS: 50.0000 mg | ORAL_TABLET | Freq: Every day | ORAL | Status: DC

## 2018-01-05 MED ORDER — SPIRONOLACTONE 25 MG PO TABS: 25.0000 mg | ORAL_TABLET | Freq: Once | ORAL | Status: DC

## 2018-01-05 MED ORDER — LORAZEPAM 1 MG PO TABS
1.0000 mg | ORAL_TABLET | Freq: Four times a day (QID) | ORAL | Status: DC | PRN
Start: 2018-01-05 — End: 2018-01-05
  Administered 2018-01-05: 11:00:00 1 mg via ORAL
  Filled 2018-01-05: qty 1

## 2018-01-05 MED ORDER — VITAMIN B-1 100 MG PO TABS: 100.0000 mg | ORAL_TABLET | Freq: Every day | ORAL | Status: DC

## 2018-01-05 MED ORDER — RIFAXIMIN 550 MG PO TABS: 550.0000 mg | ORAL_TABLET | Freq: Two times a day (BID) | ORAL | 0 refills | Status: DC

## 2018-01-05 MED ORDER — LORAZEPAM 2 MG/ML IJ SOLN: 1.0000 mg | Freq: Four times a day (QID) | INTRAMUSCULAR | Status: DC | PRN

## 2018-01-05 NOTE — Progress Notes (Signed)
[facility]  ROUNDING NOTE   Subjective:   Na 129 Furosemide 20mg  daily and spironolactone 50mg  daily  Objective:  Vital signs in last 24 hours:  Temp:  [97.7 F (36.5 C)-98.2 F (36.8 C)] 97.7 F (36.5 C) (04/24 0414) Pulse Rate:  [89-96] 92 (04/24 0414) Resp:  [17-20] 17 (04/24 0414) BP: (90-106)/(65-73) 102/69 (04/24 0414) SpO2:  [97 %-99 %] 97 % (04/24 0414)  Weight change:  Filed Weights   12/31/17 1741  Weight: 99.3 kg (218 lb 14.4 oz)    Intake/Output: I/O last 3 completed shifts: In: 0  Out: 1100 [Urine:1100]   Intake/Output this shift:  No intake/output data recorded.  Physical Exam: General: NAD,   Head: Normocephalic, atraumatic. Moist oral mucosal membranes  Eyes: Anicteric, PERRL  Neck: Supple, trachea midline  Lungs:  Clear to auscultation  Heart: Regular rate and rhythm  Abdomen:  Soft, nontender, distended  Extremities:  no peripheral edema.  Neurologic: Nonfocal, moving all four extremities  Skin: No lesions        Basic Metabolic Panel: Recent Labs  Lab 12/31/17 1123  12/31/17 1127 01/01/18 0502 01/01/18 1618 01/02/18 0455 01/03/18 0503 01/04/18 0509 01/05/18 0435  NA  --    < > 115* 116* 118* 122* 124* 127* 129*  K  --   --  5.2* 4.8  --  4.3 4.7 4.6  --   CL  --   --  79* 87*  --  90* 94* 97*  --   CO2  --   --  25 23  --  25 26 24   --   GLUCOSE  --   --  127* 109*  --  117* 116* 110*  --   BUN  --   --  7 10  --  7 6 5*  --   CREATININE  --   --  0.74 0.64  --  0.58* 0.55* 0.51*  --   CALCIUM  --    < > 8.5* 7.5*  --  8.1* 7.8* 8.0*  --   MG 1.5*  --   --   --   --   --   --   --   --    < > = values in this interval not displayed.    Liver Function Tests: Recent Labs  Lab 12/31/17 1127 01/01/18 0502  AST 140* 85*  ALT 62 41  ALKPHOS 287* 208*  BILITOT 4.7* 4.2*  PROT 6.3* 4.6*  ALBUMIN 3.0* 2.2*   No results for input(s): LIPASE, AMYLASE in the last 168 hours. Recent Labs  Lab 12/31/17 1123   AMMONIA 17    CBC: Recent Labs  Lab 12/31/17 1127 01/01/18 0502 01/02/18 0455  WBC 21.0* 14.5* 10.9*  HGB 14.4 11.8* 13.0  HCT 41.7 33.7* 37.0*  MCV 101.0* 102.8* 103.4*  PLT 364 257 264    Cardiac Enzymes: Recent Labs  Lab 12/31/17 1127  TROPONINI <0.03    BNP: Invalid input(s): POCBNP  CBG: No results for input(s): GLUCAP in the last 168 hours.  Microbiology: Results for orders placed or performed during the hospital encounter of 12/31/17  Acid Fast Smear (AFB)     Status: None   Collection Time: 01/03/18 11:09 AM  Result Value Ref Range Status   AFB Specimen Processing Concentration  Final   Acid Fast Smear Negative  Final    Comment: (NOTE) Performed At: [facility] [address] [place], [place] [id number] Rush Farmer MD [id number]  Source (AFB) PERITONEAL  Final    Comment: Performed at [facility], [address] [place], [place] [address]  Body fluid culture     Status: None (Preliminary result)   Collection Time: 01/03/18 11:09 AM  Result Value Ref Range Status   Specimen Description   Final    PERITONEAL Performed at [facility], [address], [place], [place] [address]    Special Requests   Final    PERITONEAL Performed at [facility], [place], [place] [address]    Gram Stain NO WBC SEEN NO ORGANISMS SEEN   Final   Culture   Final    NO GROWTH 2 DAYS Performed at [facility], [place] [address], [place], [place] [address]    Report Status PENDING  Incomplete    Coagulation Studies: No results for input(s): LABPROT, INR in the last 72 hours.  Urinalysis: No results for input(s): COLORURINE, LABSPEC, PHURINE, GLUCOSEU, HGBUR, BILIRUBINUR, KETONESUR, PROTEINUR, UROBILINOGEN, NITRITE, LEUKOCYTESUR in the last 72 hours.  Invalid input(s): APPERANCEUR    Imaging: No results found.   Medications:    . aspirin EC  81 mg Oral Daily  . buPROPion  300 mg Oral  Daily  . folic acid  1 mg Oral Daily  . furosemide  20 mg Oral Daily  . levothyroxine  50 mcg Oral QAC breakfast  . multivitamin with minerals  1 tablet Oral Daily  . rifaximin  550 mg Oral BID  . spironolactone  25 mg Oral Once  . [START ON 01/06/2018] spironolactone  50 mg Oral Daily  . thiamine  100 mg Oral Daily   Or  . thiamine  100 mg Intravenous Daily  . traZODone  100 mg Oral QHS   acetaminophen **OR** acetaminophen, albuterol, bisacodyl, HYDROcodone-acetaminophen, lip balm, LORazepam **OR** LORazepam, ondansetron **OR** ondansetron (ZOFRAN) IV, senna-docusate  Assessment/ Plan:  Mr. Gabriel Gray is a 57 y.o. white male with hepatic cirrhosis, alcohol abuse, hypertension, depression, anxiety and coronary artery disease, who was admitted to [facility] on 12/31/2017 for Hyponatremia [E87.1]  1. Hyponatremia 2. Hepatic Cirrhosis 3. Alcohol abuse 4. Ascites: status post large volume paracentesis on 4/22.  5. Hyperkalemia on admission  Plan: Continue fluid restriction Continue diuretics: furosemide and spironolactone.    LOS: 5 Gabriel Gray 4/24/20191:55 PM

## 2018-01-05 NOTE — Discharge Summary (Signed)
[place] [facility] [place] NAME: Gabriel Gray    MR#:  [id number]  DATE OF BIRTH:  11/29/1960  DATE OF ADMISSION:  12/31/2017 ADMITTING PHYSICIAN: Demetrios Loll, MD  DATE OF DISCHARGE: 01/05/2018  PRIMARY CARE PHYSICIAN: Gennaro Africa, MD    ADMISSION DIAGNOSIS:  Hyponatremia [E87.1]  DISCHARGE DIAGNOSIS:  Active Problems:   Hyponatremia Decompensated liver disease  SECONDARY DIAGNOSIS:   Past Medical History:  Diagnosis Date  . Anxiety   . Coronary artery disease   . Depression   . Hypertension     HOSPITAL COURSE:   57 year old male with history of EtOH related liver cirrhosis, CAD and essential hypertension who presented with abdominal distention and found to have hyponatremia.  1.  Decompensated liver disease: Patient was evaluated by GI. Patient has portal hypertension manifested by ascites and thrombocytopenia.  He is status post ultrasound-guided paracentesis with 5 L of fluid removed.  Recommendations were for Lasix and Aldactone. EGD is recommended as an outpatient. Continue Xifaxan 2.  Hyponatremia: Hyponatremia is primarily due to to EtOH abuse and hepatic cirrhosis. Patient was evaluated by nephrology. Recommendations are for 1200 cc fluid restriction and Lasix. Sodium level has improved He will be discharged on oral Lasix as per recommendations by GI as well as nephrology.  3.  EtOH abuse: Patient was on CIWA protocol with uneventful detox.  4.  Hypothyroidism: Continue Synthroid   DISCHARGE CONDITIONS AND DIET:   Stable for discharge on low-sodium diet with 1200 cc fluid restriction  CONSULTS OBTAINED:  Treatment Team:  Lin Landsman, MD Lavonia Dana, MD  DRUG ALLERGIES:  No Known Allergies  DISCHARGE MEDICATIONS:   Allergies as of 01/05/2018   No Known Allergies     Medication List    STOP taking these medications   traZODone 50 MG tablet Commonly known as:  DESYREL     TAKE these  medications   buPROPion 300 MG 24 hr tablet Commonly known as:  WELLBUTRIN XL Take 1 tablet by mouth daily.   ECOTRIN LOW STRENGTH 81 MG EC tablet Generic drug:  aspirin Take 1 tablet by mouth daily.   furosemide 20 MG tablet Commonly known as:  LASIX Take 1 tablet (20 mg total) by mouth daily. Start taking on:  01/06/2018   levothyroxine 50 MCG tablet Commonly known as:  SYNTHROID, LEVOTHROID Take 1 tablet by mouth daily.   ondansetron 8 MG disintegrating tablet Commonly known as:  ZOFRAN-ODT Take 1 tablet by mouth 3 (three) times daily as needed.   rifaximin 550 MG Tabs tablet Commonly known as:  XIFAXAN Take 1 tablet (550 mg total) by mouth 2 (two) times daily.   spironolactone 50 MG tablet Commonly known as:  ALDACTONE Take 1 tablet (50 mg total) by mouth daily. Start taking on:  01/06/2018   VITAMIN B COMPLEX PO Take 1 tablet by mouth daily.         Today   CHIEF COMPLAINT:   Patient doing well this morning.   VITAL SIGNS:  Blood pressure 102/69, pulse 92, temperature 97.7 F (36.5 C), temperature source Oral, resp. rate 17, height 6' (1.829 m), weight 99.3 kg (218 lb 14.4 oz), SpO2 97 %.   REVIEW OF SYSTEMS:  Review of Systems  Constitutional: Positive for malaise/fatigue. Negative for chills and fever.  HENT: Negative.  Negative for ear discharge, ear pain, hearing loss, nosebleeds and sore throat.   Eyes: Negative.  Negative for blurred vision and pain.  Respiratory: Negative.  Negative for cough, hemoptysis, shortness of breath and wheezing.   Cardiovascular: Negative.  Negative for chest pain, palpitations and leg swelling.  Gastrointestinal: Negative.  Negative for abdominal pain, blood in stool, diarrhea, nausea and vomiting.  Genitourinary: Negative.  Negative for dysuria.  Musculoskeletal: Negative.  Negative for back pain.  Skin: Negative.   Neurological: Negative for dizziness, tremors, speech change, focal weakness, seizures and headaches.   Endo/Heme/Allergies: Negative.  Does not bruise/bleed easily.  Psychiatric/Behavioral: Negative.  Negative for depression, hallucinations and suicidal ideas.     PHYSICAL EXAMINATION:  GENERAL:  57 y.o.-year-old patient lying in the bed with no acute distress.  NECK:  Supple, no jugular venous distention. No thyroid enlargement, no tenderness.  LUNGS: Normal breath sounds bilaterally, no wheezing, rales,rhonchi  No use of accessory muscles of respiration.  CARDIOVASCULAR: S1, S2 normal. No murmurs, rubs, or gallops.  ABDOMEN: Soft, non-tender, non-distended. Bowel sounds present. No organomegaly or mass.  EXTREMITIES: No pedal edema, cyanosis, or clubbing.  PSYCHIATRIC: The patient is alert and oriented x 3.  SKIN: No obvious rash, lesion, or ulcer.   DATA REVIEW:   CBC Recent Labs  Lab 01/02/18 0455  WBC 10.9*  HGB 13.0  HCT 37.0*  PLT 264    Chemistries  Recent Labs  Lab 12/31/17 1123  01/01/18 0502  01/04/18 0509 01/05/18 0435  NA  --    < > 116*   < > 127* 129*  K  --    < > 4.8   < > 4.6  --   CL  --    < > 87*   < > 97*  --   CO2  --    < > 23   < > 24  --   GLUCOSE  --    < > 109*   < > 110*  --   BUN  --    < > 10   < > 5*  --   CREATININE  --    < > 0.64   < > 0.51*  --   CALCIUM  --    < > 7.5*   < > 8.0*  --   MG 1.5*  --   --   --   --   --   AST  --    < > 85*  --   --   --   ALT  --    < > 41  --   --   --   ALKPHOS  --    < > 208*  --   --   --   BILITOT  --    < > 4.2*  --   --   --    < > = values in this interval not displayed.    Cardiac Enzymes Recent Labs  Lab 12/31/17 1127  TROPONINI <0.03    Microbiology Results  @MICRORSLT48 @  RADIOLOGY:  No results found.    Allergies as of 01/05/2018   No Known Allergies     Medication List    STOP taking these medications   traZODone 50 MG tablet Commonly known as:  DESYREL     TAKE these medications   buPROPion 300 MG 24 hr tablet Commonly known as:  WELLBUTRIN XL Take 1  tablet by mouth daily.   ECOTRIN LOW STRENGTH 81 MG EC tablet Generic drug:  aspirin Take 1 tablet by mouth daily.   furosemide 20 MG tablet Commonly known as:  LASIX Take  1 tablet (20 mg total) by mouth daily. Start taking on:  01/06/2018   levothyroxine 50 MCG tablet Commonly known as:  SYNTHROID, LEVOTHROID Take 1 tablet by mouth daily.   ondansetron 8 MG disintegrating tablet Commonly known as:  ZOFRAN-ODT Take 1 tablet by mouth 3 (three) times daily as needed.   rifaximin 550 MG Tabs tablet Commonly known as:  XIFAXAN Take 1 tablet (550 mg total) by mouth 2 (two) times daily.   spironolactone 50 MG tablet Commonly known as:  ALDACTONE Take 1 tablet (50 mg total) by mouth daily. Start taking on:  01/06/2018   VITAMIN B COMPLEX PO Take 1 tablet by mouth daily.          Management plans discussed with the patient and he is in agreement. Stable for discharge   Patient should follow up with pcp  CODE STATUS:     Code Status Orders  (From admission, onward)        Start     Ordered   12/31/17 1731  Full code  Continuous     12/31/17 1730    Code Status History    This patient has a current code status but no historical code status.      TOTAL TIME TAKING CARE OF THIS PATIENT: 38 minutes.    Note: This dictation was prepared with Dragon dictation along with smaller phrase technology. Any transcriptional errors that result from this process are unintentional.  Lyssa Hackley M.D on 01/05/2018 at 12:35 PM  Between 7am to 6pm - Pager - [phone] After 6pm go to [email or web address] - password [email or web address] [facility]  Office  [phone]  CC: Primary care physician; Gennaro Africa, MD

## 2018-01-05 NOTE — Clinical Social Work Placement (Signed)
   CLINICAL SOCIAL WORK PLACEMENT  NOTE  Date:  01/05/2018  Patient Details  Name: Gabriel Gray MRN: [id number] Date of Birth: 1961-06-07  Clinical Social Work is seeking post-discharge placement for this patient at the [place] level of care (*CSW will initial, date and re-position this form in  chart as items are completed):  Yes   Patient/family provided with [facility] Work Department's list of facilities offering this level of care within the geographic area requested by the patient (or if unable, by the patient's family).  Yes   Patient/family informed of their freedom to choose among providers that offer the needed level of care, that participate in Medicare, Medicaid or managed care program needed by the patient, have an available bed and are willing to accept the patient.  Yes   Patient/family informed of [facility]'s ownership interest in [facility] and [facility], as well as of the fact that they are under no obligation to receive care at these facilities.  PASRR submitted to EDS on       PASRR number received on       Existing PASRR number confirmed on 01/03/18     FL2 transmitted to all facilities in geographic area requested by pt/family on 01/03/18     FL2 transmitted to all facilities within larger geographic area on       Patient informed that his/her managed care company has contracts with or will negotiate with certain facilities, including the following:        Yes   Patient/family informed of bed offers received.  Patient chooses bed at [facility] )     Physician recommends and patient chooses bed at      Patient to be transferred to [facility] ) on 01/05/18.  Patient to be transferred to facility by [facility] )     Patient family notified on 01/05/18 of transfer.  Name of family member notified:  (Patient's wife Gabriel Gray is aware of D/C today.  )     PHYSICIAN        Additional Comment:    _______________________________________________ Katerra Ingman, [name], LCSW 01/05/2018, 12:46 PM

## 2018-01-05 NOTE — [place] Care Team (Signed)
Transported via EMS to facility. Report provided prior to transport

## 2018-01-05 NOTE — Progress Notes (Signed)
Provided report to Lillia Carmel, RN at [facility] [phone]. No questions at conclusion of report. Nurse stated she had not been provided with information about pt being admitted. My Name with postion and unit number provided should further questions occur.

## 2018-01-05 NOTE — Progress Notes (Signed)
Patient is medically stable for D/C to [facility] today. Per Claiborne Billings admissions coordinator at [facility] patient can come today and [facility] authorization has been received. RN will call report and arrange EMS for transport. Clinical Education officer, museum (CSW) sent D/C orders to [facility] via [place]. Patient is aware of above. CSW contacted patient's wife [name] and made her aware of above. Please reconsult if future social work needs arise. CSW signing off.   [employer], LCSW [phone]

## 2018-01-06 LAB — BODY FLUID CULTURE
Culture: NO GROWTH
GRAM STAIN: NONE SEEN

## 2018-01-18 ENCOUNTER — Other Ambulatory Visit: Payer: Self-pay | Admitting: Family Medicine

## 2018-01-18 DIAGNOSIS — K7031 Alcoholic cirrhosis of liver with ascites: Secondary | ICD-10-CM

## 2018-01-20 ENCOUNTER — Ambulatory Visit
Admission: RE | Admit: 2018-01-20 | Discharge: 2018-01-20 | Disposition: A | Discharge status: Home / Self Care | Payer: BLUE CROSS/BLUE SHIELD | Source: Ambulatory Visit | Attending: Family Medicine | Admitting: Family Medicine

## 2018-01-20 ENCOUNTER — Other Ambulatory Visit: Payer: Self-pay | Admitting: Family Medicine

## 2018-01-20 DIAGNOSIS — K7031 Alcoholic cirrhosis of liver with ascites: Secondary | ICD-10-CM | POA: Insufficient documentation

## 2018-01-20 NOTE — Procedures (Signed)
US paracentesis.  Removed 4.5 liters of fluid.  Minimal blood loss and no immediate complication.

## 2018-01-25 ENCOUNTER — Other Ambulatory Visit: Payer: Self-pay

## 2018-01-25 ENCOUNTER — Emergency Department: Payer: BLUE CROSS/BLUE SHIELD

## 2018-01-25 ENCOUNTER — Emergency Department
Admission: EM | Admit: 2018-01-25 | Discharge: 2018-01-25 | Disposition: A | Discharge status: Home / Self Care | Payer: BLUE CROSS/BLUE SHIELD | Attending: Emergency Medicine | Admitting: Emergency Medicine

## 2018-01-25 ENCOUNTER — Encounter: Payer: Self-pay | Admitting: Emergency Medicine

## 2018-01-25 DIAGNOSIS — I10 Essential (primary) hypertension: Secondary | ICD-10-CM | POA: Insufficient documentation

## 2018-01-25 DIAGNOSIS — Z7982 Long term (current) use of aspirin: Secondary | ICD-10-CM | POA: Diagnosis not present

## 2018-01-25 DIAGNOSIS — Z79899 Other long term (current) drug therapy: Secondary | ICD-10-CM | POA: Insufficient documentation

## 2018-01-25 DIAGNOSIS — K5900 Constipation, unspecified: Secondary | ICD-10-CM | POA: Insufficient documentation

## 2018-01-25 DIAGNOSIS — S0101XA Laceration without foreign body of scalp, initial encounter: Secondary | ICD-10-CM | POA: Diagnosis not present

## 2018-01-25 DIAGNOSIS — Y92129 Unspecified place in nursing home as the place of occurrence of the external cause: Secondary | ICD-10-CM | POA: Diagnosis not present

## 2018-01-25 DIAGNOSIS — W01198A Fall on same level from slipping, tripping and stumbling with subsequent striking against other object, initial encounter: Secondary | ICD-10-CM | POA: Insufficient documentation

## 2018-01-25 DIAGNOSIS — Y9389 Activity, other specified: Secondary | ICD-10-CM | POA: Diagnosis not present

## 2018-01-25 DIAGNOSIS — W19XXXA Unspecified fall, initial encounter: Secondary | ICD-10-CM

## 2018-01-25 DIAGNOSIS — Y999 Unspecified external cause status: Secondary | ICD-10-CM | POA: Insufficient documentation

## 2018-01-25 DIAGNOSIS — I2510 Atherosclerotic heart disease of native coronary artery without angina pectoris: Secondary | ICD-10-CM | POA: Insufficient documentation

## 2018-01-25 MED ORDER — LIDOCAINE-EPINEPHRINE-TETRACAINE (LET) SOLUTION
3.0000 mL | Freq: Once | NASAL | Status: AC
  Administered 2018-01-25: 3 mL via TOPICAL
  Filled 2018-01-25: qty 3

## 2018-01-25 MED ORDER — ONDANSETRON 4 MG PO TBDP
ORAL_TABLET | ORAL | Status: AC
  Administered 2018-01-25: 4 mg via ORAL
  Filled 2018-01-25: qty 1

## 2018-01-25 MED ORDER — ONDANSETRON 4 MG PO TBDP
4.0000 mg | ORAL_TABLET | Freq: Once | ORAL | Status: AC
  Administered 2018-01-25: 4 mg via ORAL

## 2018-01-25 NOTE — ED Notes (Signed)
Attempted to call wife for transport back to [facility] [place] [facility] but unable to reach her.  Will arrange for EMS transport.

## 2018-01-25 NOTE — ED Notes (Signed)
Pt unable to sign due to no signature pad available.

## 2018-01-25 NOTE — ED Notes (Signed)
Lac noted with steri strips from [facility] small amount of bleeding around strips. Last bm: small hard stool, around a week ago.

## 2018-01-25 NOTE — ED Triage Notes (Signed)
States he hit his head on a doorknob causing the lac.

## 2018-01-25 NOTE — ED Notes (Signed)
Patient transported to CT 

## 2018-01-25 NOTE — ED Notes (Signed)
LET applied per order. Pt tolerated well.

## 2018-01-25 NOTE — ED Notes (Signed)
Pt provided with paper scrubs. Transported back to facility by EMS

## 2018-01-25 NOTE — ED Notes (Signed)
LET scanned prior to pt being taken to xray, will place on pt when he comes back.

## 2018-01-25 NOTE — ED Notes (Signed)
Returned from CT.

## 2018-01-25 NOTE — ED Provider Notes (Signed)
[facility] Emergency Department Provider Note  Time seen: 9:06 AM  I have reviewed the triage vital signs and the nursing notes.   HISTORY  Chief Complaint Fall and Constipation    HPI Gabriel Gray is a 57 y.o. male with a past medical history of depression, hypertension, hepatitis, alcoholic cirrhosis, cognitive impairment, presents to the emergency department from a nursing facility after a fall.  According to report into the patient he was attempting to get to the restroom, states his feet came out from under him and he fell backwards hitting the back of his head on a doorknob.  Patient denies loss of consciousness.  Patient states he has been constipated and states he felt like he needed to have a bowel movement which is why he was hurrying to the bathroom.  States he is currently taking lactulose, developed loose stool so the patient took Imodium approximately 5 to 6 days ago.  Since then he has been constipated besides intermittent hard small stools per patient.  No abdominal pain nausea or vomiting.  Denies LOC.  Denies weakness or numbness.  Overall appears well, no distress at this time.   Past Medical History:  Diagnosis Date  . Anxiety   . Coronary artery disease   . Depression   . Hypertension     Patient Active Problem List   Diagnosis Date Noted  . Ascites 01/05/2018  . Hyponatremia 12/31/2017    History reviewed. No pertinent surgical history.  Prior to Admission medications   Medication Sig Start Date End Date Taking? Authorizing Provider  aspirin (ECOTRIN LOW STRENGTH) 81 MG EC tablet Take 1 tablet by mouth daily. 12/21/08   [provider]  B Complex Vitamins (VITAMIN B COMPLEX PO) Take 1 tablet by mouth daily. 08/30/17 08/30/18  [provider]  buPROPion (WELLBUTRIN XL) 300 MG 24 hr tablet Take 1 tablet by mouth daily. 08/25/17   [provider]  furosemide (LASIX) 20 MG tablet Take 1 tablet (20 mg total)  by mouth daily. 01/06/18   Bettey Costa, MD  levothyroxine (SYNTHROID, LEVOTHROID) 50 MCG tablet Take 1 tablet by mouth daily. 08/30/17 08/30/18  [provider]  ondansetron (ZOFRAN-ODT) 8 MG disintegrating tablet Take 1 tablet by mouth 3 (three) times daily as needed. 12/29/17   [provider]  rifaximin (XIFAXAN) 550 MG TABS tablet Take 1 tablet (550 mg total) by mouth 2 (two) times daily. 01/05/18   Bettey Costa, MD  spironolactone (ALDACTONE) 50 MG tablet Take 1 tablet (50 mg total) by mouth daily. 01/06/18   Bettey Costa, MD    No Known Allergies  Family History  Problem Relation Age of Onset  . Hypertension Mother   . Hypertension Father   . Prostate cancer Father     Social History Social History   Tobacco Use  . Smoking status: Never Smoker  . Smokeless tobacco: Never Used  Substance Use Topics  . Alcohol use: Yes    Alcohol/week: 1.2 - 1.8 oz    Types: 2 - 3 Glasses of wine per week    Comment: per night  . Drug use: No    Review of Systems Constitutional: Negative for loss of consciousness Eyes: Negative for visual complaints ENT: Negative for recent illness/congestion Cardiovascular: Negative for chest pain. Respiratory: Negative for shortness of breath. Gastrointestinal: States intermittent constipation x1 week.  Denies vomiting. Genitourinary: Negative for urinary compaints Musculoskeletal: Negative for musculoskeletal complaints Skin: Laceration to back of scalp. Neurological: Negative for headache  All other ROS negative  ____________________________________________   PHYSICAL EXAM:  VITAL SIGNS: ED Triage Vitals  Enc Vitals Group     BP 01/25/18 0850 (!) 131/97     Pulse Rate 01/25/18 0850 96     Resp 01/25/18 0850 18     Temp 01/25/18 0850 98 F (36.7 C)     Temp Source 01/25/18 0850 Oral     SpO2 01/25/18 0850 100 %     Weight 01/25/18 0851 200 lb (90.7 kg)     Height 01/25/18 0851 6' (1.829 m)     Head Circumference --       Peak Flow --      Pain Score 01/25/18 0851 0     Pain Loc --      Pain Edu? --      Excl. in [place]? --     Constitutional: Alert, overall well-appearing, no distress. Eyes: Normal exam ENT   Head: Patient has approximate 3 cm laceration to occipital scalp, currently hemostatic with Steri-Strips applied but gaping.   Mouth/Throat: Mucous membranes are moist. Cardiovascular: Normal rate, regular rhythm. Respiratory: Normal respiratory effort without tachypnea nor retractions. Breath sounds are clea Gastrointestinal: Soft and nontender. No distention.  Musculoskeletal: Nontender with normal range of motion  Neurologic:  Normal speech and language. No gross focal neurologic deficits Skin:  Skin is warm, dry and intact.  Psychiatric: Mood and affect are normal.   ____________________________________________   RADIOLOGY  CT had negative X-ray shows mild to moderate constipation.  ____________________________________________   INITIAL IMPRESSION / ASSESSMENT AND PLAN / ED COURSE  Pertinent labs & imaging results that were available during my care of the patient were reviewed by me and considered in my medical decision making (see chart for details).  Patient presents to the emergency department after a fall suffering a laceration to the occipital scalp.  No LOC.  Patient complains of intermittent constipation x5 to 6 days after taking Imodium.  Patient takes lactulose and developed loose stool which is why he took Imodium.  He did not know per patient that lactulose caused loose stool.  States he has had several hard small bowel movements but has not had a large bowel movement since taking the Imodium.  Patient felt like he needed to have a bowel movement today which is why he was going to the bathroom but fell hitting the back of his head on the doorknob.  Patient has a 3 cm laceration to occipital scalp, hemostatic but gaping open.  Will require stapling for wound repair.  Will  obtain a CT scan of the head to rule out ICH, low clinical suspicion.  Patient agreeable to plan of care.  Nontender abdomen, though patient is complaining of intermittent constipation he is still having bowel movements, do not believe abdominal imaging is required at this time.  CT head negative.  X-ray shows increase stool burden.  But no obstruction or ileus.  Patient currently taking lactulose which I believe is sufficient.  Repair the laceration with 3 staples after topical let application.  Patient tolerated very well.  LACERATION REPAIR Performed by: Harvest Dark Authorized by: Harvest Dark Consent: Verbal consent obtained. Risks and benefits: risks, benefits and alternatives were discussed Consent given by: patient Patient identity confirmed: provided demographic data Prepped and Draped in normal sterile fashion Wound explored  Laceration Location: occipital scalp  Laceration Length: 3cm  No Foreign Bodies seen or palpated  Anesthesia: topical  Local anesthetic: Topicl LET  Anesthetic total: 3  ml  Amount of cleaning: standard  Skin closure: staples  Number of staples: 3  Technique: staples  Patient tolerance: Patient tolerated the procedure well with no immediate complications.   ____________________________________________   FINAL CLINICAL IMPRESSION(S) / ED DIAGNOSES  scalp laceration fall constipation    Harvest Dark, MD 01/25/18 [phone]

## 2018-01-25 NOTE — Discharge Instructions (Addendum)
He has been seen in the emergency department today after a fall with a scalp laceration.  This has been repaired with 3 staples which will need to be removed in approximately 7 days.  You may wash the area with warm water and gentle soap, allowed to air dry or blow dry.  Do not rub dry.  Your x-ray also shows moderate constipation, please continue to use your lactulose, avoid using Imodium/loperamide.  Please follow-up with your doctor in 1 to 2 days for recheck/reevaluation.  Return to the emergency department for any personally concerning symptoms.

## 2018-01-25 NOTE — ED Notes (Signed)
offerred pt crackers while waiting, declined.

## 2018-01-25 NOTE — ED Triage Notes (Signed)
Pt here via EMS from [facility] with c/o slipping and falling this am on the way to the bathroom. States he has been constipated for the past week, small lac to upper right back of head. Denies pain at this time.

## 2018-02-02 ENCOUNTER — Other Ambulatory Visit: Payer: Self-pay | Admitting: Family Medicine

## 2018-02-02 DIAGNOSIS — R188 Other ascites: Secondary | ICD-10-CM

## 2018-02-03 ENCOUNTER — Ambulatory Visit
Admission: RE | Admit: 2018-02-03 | Discharge: 2018-02-03 | Disposition: A | Discharge status: Home / Self Care | Payer: BLUE CROSS/BLUE SHIELD | Source: Ambulatory Visit | Attending: Family Medicine | Admitting: Family Medicine

## 2018-02-03 DIAGNOSIS — R188 Other ascites: Secondary | ICD-10-CM

## 2018-02-03 DIAGNOSIS — K7031 Alcoholic cirrhosis of liver with ascites: Secondary | ICD-10-CM | POA: Insufficient documentation

## 2018-02-03 MED ORDER — ALBUMIN HUMAN 25 % IV SOLN
INTRAVENOUS | Status: AC
  Administered 2018-02-03: 25 g
  Filled 2018-02-03: qty 100

## 2018-02-03 MED ORDER — ALBUMIN HUMAN 25 % IV SOLN: 25.0000 g | Freq: Once | INTRAVENOUS | Status: DC

## 2018-02-03 NOTE — Progress Notes (Signed)
Office of Dr. Tamala Julian called and spoke with Gabriel Slater LPN. Per LPN MD clarification on Albumin infusion. Pt to receive 25 Gm Albumin with today's Paracentesis with a max of 6 L fluid removed. Orders placed

## 2018-02-11 ENCOUNTER — Other Ambulatory Visit: Payer: Self-pay | Admitting: Family Medicine

## 2018-02-11 ENCOUNTER — Ambulatory Visit
Admission: RE | Admit: 2018-02-11 | Discharge: 2018-02-11 | Disposition: A | Discharge status: Home / Self Care | Payer: BLUE CROSS/BLUE SHIELD | Source: Ambulatory Visit | Attending: Family Medicine | Admitting: Family Medicine

## 2018-02-11 DIAGNOSIS — K7031 Alcoholic cirrhosis of liver with ascites: Secondary | ICD-10-CM

## 2018-02-11 MED ORDER — ALBUMIN HUMAN 25 % IV SOLN: 25.0000 g | Freq: Once | INTRAVENOUS | Status: DC

## 2018-02-11 MED ORDER — ALBUMIN HUMAN 25 % IV SOLN
INTRAVENOUS | Status: AC
  Administered 2018-02-11: 25 g
  Filled 2018-02-11: qty 100

## 2018-02-11 MED ORDER — ALBUMIN HUMAN 25 % IV SOLN: 12.5000 g | Freq: Once | INTRAVENOUS | Status: DC

## 2018-02-11 NOTE — Procedures (Signed)
ASCITES  S/P [place] PARA  5.7 L NO COMP STABLE EBL 0 NO LABS FULL REPORT IN PACS

## 2018-02-14 LAB — ACID FAST CULTURE WITH REFLEXED SENSITIVITIES: ACID FAST CULTURE - AFSCU3: NEGATIVE

## 2018-02-17 ENCOUNTER — Ambulatory Visit: Admission: RE | Admit: 2018-02-17 | Payer: BLUE CROSS/BLUE SHIELD | Source: Ambulatory Visit

## 2018-02-24 ENCOUNTER — Ambulatory Visit
Admission: RE | Admit: 2018-02-24 | Discharge: 2018-02-24 | Disposition: A | Discharge status: Home / Self Care | Payer: BLUE CROSS/BLUE SHIELD | Source: Ambulatory Visit | Attending: Family Medicine | Admitting: Family Medicine

## 2018-02-24 DIAGNOSIS — K7031 Alcoholic cirrhosis of liver with ascites: Secondary | ICD-10-CM

## 2018-02-24 DIAGNOSIS — R188 Other ascites: Secondary | ICD-10-CM | POA: Insufficient documentation

## 2018-02-24 MED ORDER — ALBUMIN HUMAN 25 % IV SOLN
INTRAVENOUS | Status: AC
  Filled 2018-02-24: qty 100

## 2018-02-24 MED ORDER — ALBUMIN HUMAN 25 % IV SOLN
25.0000 g | Freq: Once | INTRAVENOUS | Status: AC
  Administered 2018-02-24: 25 g via INTRAVENOUS

## 2018-02-28 ENCOUNTER — Ambulatory Visit: Payer: BLUE CROSS/BLUE SHIELD | Admitting: Gastroenterology

## 2018-03-03 ENCOUNTER — Ambulatory Visit: Payer: BLUE CROSS/BLUE SHIELD

## 2018-03-10 ENCOUNTER — Ambulatory Visit
Admission: RE | Admit: 2018-03-10 | Discharge: 2018-03-10 | Disposition: A | Discharge status: Home / Self Care | Payer: BLUE CROSS/BLUE SHIELD | Source: Ambulatory Visit | Attending: Family Medicine | Admitting: Family Medicine

## 2018-03-10 ENCOUNTER — Encounter: Payer: Self-pay | Admitting: Gastroenterology

## 2018-03-10 DIAGNOSIS — K7031 Alcoholic cirrhosis of liver with ascites: Secondary | ICD-10-CM | POA: Diagnosis not present

## 2018-03-10 DIAGNOSIS — R188 Other ascites: Secondary | ICD-10-CM | POA: Diagnosis present

## 2018-03-10 MED ORDER — ALBUMIN HUMAN 25 % IV SOLN
INTRAVENOUS | Status: AC
  Administered 2018-03-10: 25 g via INTRAVENOUS
  Filled 2018-03-10: qty 100

## 2018-03-10 MED ORDER — ALBUMIN HUMAN 25 % IV SOLN
25.0000 g | Freq: Once | INTRAVENOUS | Status: AC
  Administered 2018-03-10: 25 g via INTRAVENOUS

## 2018-03-10 NOTE — Procedures (Signed)
US guided paracentesis. Removed 7 liters.  Minimal blood loss and no immediate complication.

## 2018-03-14 ENCOUNTER — Ambulatory Visit: Payer: BLUE CROSS/BLUE SHIELD | Admitting: Gastroenterology

## 2018-03-24 ENCOUNTER — Other Ambulatory Visit: Payer: Self-pay | Admitting: Family Medicine

## 2018-03-24 ENCOUNTER — Ambulatory Visit
Admission: RE | Admit: 2018-03-24 | Discharge: 2018-03-24 | Disposition: A | Discharge status: Home / Self Care | Payer: BLUE CROSS/BLUE SHIELD | Source: Ambulatory Visit | Attending: Family Medicine | Admitting: Family Medicine

## 2018-03-24 DIAGNOSIS — R188 Other ascites: Secondary | ICD-10-CM | POA: Diagnosis not present

## 2018-03-24 DIAGNOSIS — K7460 Unspecified cirrhosis of liver: Secondary | ICD-10-CM | POA: Diagnosis present

## 2018-03-24 DIAGNOSIS — K7031 Alcoholic cirrhosis of liver with ascites: Secondary | ICD-10-CM

## 2018-03-24 MED ORDER — ALBUMIN HUMAN 25 % IV SOLN
INTRAVENOUS | Status: AC
  Administered 2018-03-24: 25 g
  Filled 2018-03-24: qty 100

## 2018-03-24 MED ORDER — ALBUMIN HUMAN 25 % IV SOLN: 12.5000 g | Freq: Once | INTRAVENOUS | Status: DC

## 2018-04-05 ENCOUNTER — Encounter: Payer: Self-pay | Admitting: Gastroenterology

## 2018-04-05 ENCOUNTER — Ambulatory Visit (INDEPENDENT_AMBULATORY_CARE_PROVIDER_SITE_OTHER): Payer: BLUE CROSS/BLUE SHIELD | Admitting: Gastroenterology

## 2018-04-05 ENCOUNTER — Other Ambulatory Visit
Admission: RE | Admit: 2018-04-05 | Discharge: 2018-04-05 | Disposition: A | Discharge status: Home / Self Care | Payer: BLUE CROSS/BLUE SHIELD | Source: Ambulatory Visit | Attending: Gastroenterology | Admitting: Gastroenterology

## 2018-04-05 VITALS — BP 122/83 | HR 105 | Ht 72.0 in | Wt 200.8 lb

## 2018-04-05 DIAGNOSIS — R748 Abnormal levels of other serum enzymes: Secondary | ICD-10-CM

## 2018-04-05 DIAGNOSIS — K7030 Alcoholic cirrhosis of liver without ascites: Secondary | ICD-10-CM

## 2018-04-05 LAB — COMPREHENSIVE METABOLIC PANEL
ALK PHOS: 133 U/L — AB (ref 38–126)
ALT: 19 U/L (ref 0–44)
ANION GAP: 6 (ref 5–15)
AST: 37 U/L (ref 15–41)
Albumin: 3.5 g/dL (ref 3.5–5.0)
BILIRUBIN TOTAL: 1.2 mg/dL (ref 0.3–1.2)
BUN: 10 mg/dL (ref 6–20)
CALCIUM: 8.9 mg/dL (ref 8.9–10.3)
CO2: 28 mmol/L (ref 22–32)
Chloride: 104 mmol/L (ref 98–111)
Creatinine, Ser: 0.72 mg/dL (ref 0.61–1.24)
GFR calc Af Amer: >60 mL/min (ref >60)
Glucose, Bld: 115 mg/dL — ABNORMAL HIGH (ref 70–99)
POTASSIUM: 3.9 mmol/L (ref 3.5–5.1)
Sodium: 138 mmol/L (ref 135–145)
TOTAL PROTEIN: 6.1 g/dL — AB (ref 6.5–8.1)

## 2018-04-05 LAB — PROTIME-INR
INR: 1.11
PROTHROMBIN TIME: 14.2 s (ref 11.4–15.2)

## 2018-04-06 LAB — HEPATITIS A ANTIBODY, TOTAL: Hep A Total Ab: NEGATIVE

## 2018-04-06 LAB — HCV COMMENT:

## 2018-04-06 LAB — HEPATITIS B SURFACE ANTIBODY,QUALITATIVE: Hep B S Ab: NONREACTIVE

## 2018-04-06 LAB — HEPATITIS C ANTIBODY (REFLEX): HCV Ab: <0.1 s/co ratio (ref 0.0–0.9)

## 2018-04-06 LAB — HEPATITIS B SURFACE ANTIGEN: Hepatitis B Surface Ag: NEGATIVE

## 2018-04-06 LAB — HEPATITIS B CORE ANTIBODY, TOTAL: Hep B Core Total Ab: NEGATIVE

## 2018-04-06 NOTE — Progress Notes (Signed)
Vonda Antigua, MD [address]  [place]  [place], [place] [address]  Main: [phone]  Fax: [phone]   Primary Care Physician: Gennaro Africa, MD  Primary Gastroenterologist:  Dr. Vonda Antigua  Chief Complaint  Patient presents with  . Cirrhosis    Alcoholic Cirrhosis with ascities    HPI: Gabriel Gray is a 57 y.o. male seen in inpatient consultation by Dr. Marius Ditch, and subsequently Dr. Vicente Males here for hospital follow-up for alcoholic cirrhosis with ascites.  Patient was admitted in the hospital in April 2019, and was in rehab after that.  History of alcoholic cirrhosis.  Has not had any alcohol since last hospital admission April 2019 as per patient.  Drank heavily for 7 to 8 years, daily, and was drinking only on weekends just prior to presentation to the hospital.  He was discharged on Lasix 20 and spironolactone 50 mg daily that he has been taking.  He has been receiving large volume paracentesis through the rehab facility.    March 24, 2018, 5.8 L removed March 10, 2018, 7 L removed February 24, 2018, 7 L removed Feb 11, 2018, 5.7 L removed Feb 03, 2018, 6 L removed Jan 20, 2018, 4.5 L removed January 03, 2018, 5 L removed  Fluid studies during the hospital admission were negative for SBP.  Cytology did not show any malignant cells.  Albumin was less than 1 on peritoneal fluid.  Serum albumin is not available from day of paracentesis, but based on albumin done 2 days prior to the paracentesis, SAAG is more than 1.1, and consistent with portal hypertension.  Current Outpatient Medications  Medication Sig Dispense Refill  . aspirin (ECOTRIN LOW STRENGTH) 81 MG EC tablet Take 1 tablet by mouth daily.    . B Complex Vitamins (VITAMIN B COMPLEX PO) Take 1 tablet by mouth daily.    Marland Kitchen buPROPion (WELLBUTRIN XL) 300 MG 24 hr tablet Take 1 tablet by mouth daily.    . furosemide (LASIX) 20 MG tablet Take 1 tablet (20 mg total) by mouth daily. 30 tablet 0  .  lactulose (CHRONULAC) 10 GM/15ML solution Take by mouth 3 (three) times daily.    Marland Kitchen levothyroxine (SYNTHROID, LEVOTHROID) 50 MCG tablet Take 1 tablet by mouth daily.    . ondansetron (ZOFRAN-ODT) 8 MG disintegrating tablet Take 1 tablet by mouth 3 (three) times daily as needed.  0  . rifaximin (XIFAXAN) 550 MG TABS tablet Take 1 tablet (550 mg total) by mouth 2 (two) times daily. 60 tablet 0  . spironolactone (ALDACTONE) 50 MG tablet Take 1 tablet (50 mg total) by mouth daily. 30 tablet 0   No current facility-administered medications for this visit.     Allergies as of 04/05/2018  . (No Known Allergies)    ROS:  General: Negative for anorexia, weight loss, fever, chills, fatigue, weakness. ENT: Negative for hoarseness, difficulty swallowing , nasal congestion. CV: Negative for chest pain, angina, palpitations, dyspnea on exertion, peripheral edema.  Respiratory: Negative for dyspnea at rest, dyspnea on exertion, cough, sputum, wheezing.  GI: See history of present illness. GU:  Negative for dysuria, hematuria, urinary incontinence, urinary frequency, nocturnal urination.  Endo: Negative for unusual weight change.    Physical Examination:   BP 122/83   Pulse (!) 105   Ht 6' (1.829 m)   Wt 200 lb 12.8 oz (91.1 kg)   BMI 27.23 kg/m   General: Well-nourished, well-developed in no acute distress.  Eyes: No icterus. Conjunctivae pink.  Mouth: Oropharyngeal mucosa moist and pink , no lesions erythema or exudate. Neck: Supple, Trachea midline Abdomen: Bowel sounds are normal, nontender, distended abdomen, positive fluid shift, no hepatosplenomegaly or masses, no abdominal bruits or hernia , no rebound or guarding.   Extremities: No lower extremity edema. No clubbing or deformities. Neuro: Alert and oriented x 3.  Grossly intact. Skin: Warm and dry, no jaundice.   Psych: Alert and cooperative, normal mood and affect.   Labs: CMP     Component Value Date/Time   NA 138 04/05/2018  1452   K 3.9 04/05/2018 1452   CL 104 04/05/2018 1452   CO2 28 04/05/2018 1452   GLUCOSE 115 (H) 04/05/2018 1452   BUN 10 04/05/2018 1452   CREATININE 0.72 04/05/2018 1452   CALCIUM 8.9 04/05/2018 1452   PROT 6.1 (L) 04/05/2018 1452   ALBUMIN 3.5 04/05/2018 1452   AST 37 04/05/2018 1452   ALT 19 04/05/2018 1452   ALKPHOS 133 (H) 04/05/2018 1452   BILITOT 1.2 04/05/2018 1452   GFRNONAA >60 04/05/2018 1452   GFRAA >60 04/05/2018 1452   Lab Results  Component Value Date   WBC 10.9 (H) 01/02/2018   HGB 13.0 01/02/2018   HCT 37.0 (L) 01/02/2018   MCV 103.4 (H) 01/02/2018   PLT 264 01/02/2018    Imaging Studies: US Paracentesis  Result Date: 03/24/2018 INDICATION: Cirrhosis and ascites. EXAM: ULTRASOUND GUIDED PARACENTESIS MEDICATIONS: None. COMPLICATIONS: None immediate. PROCEDURE: Informed written consent was obtained from the patient after a discussion of the risks, benefits and alternatives to treatment. A timeout was performed prior to the initiation of the procedure. Initial ultrasound was performed to localize ascites. The left lower abdominal wall was prepped and draped in the usual sterile fashion. 1% lidocaine with epinephrine was used for local anesthesia. Following this, a 6 Fr Safe-T-Centesis catheter was introduced. An ultrasound image was saved for documentation purposes. The paracentesis was performed. The catheter was removed and a dressing was applied. The patient tolerated the procedure well without immediate post procedural complication. FINDINGS: A total of approximately 5.8 L of yellowish fluid was removed. IMPRESSION: Successful ultrasound-guided paracentesis yielding 5.8 liters of peritoneal fluid. Electronically Signed   By: Aletta Edouard M.D.   On: 03/24/2018 12:05   US Paracentesis  Result Date: 03/10/2018 INDICATION: 57 year old with alcoholic cirrhosis of liver with ascites. EXAM: ULTRASOUND GUIDED THERAPEUTIC PARACENTESIS MEDICATIONS: None. COMPLICATIONS:  None immediate. PROCEDURE: Informed written consent was obtained from the patient after a discussion of the risks, benefits and alternatives to treatment. A timeout was performed prior to the initiation of the procedure. Initial ultrasound scanning demonstrates a large amount of ascites within the left lower abdominal quadrant. The left lower abdomen was prepped and draped in the usual sterile fashion. 1% lidocaine was used for local anesthesia. Following this, a 6 Fr Safe-T-Centesis catheter was introduced. An ultrasound image was saved for documentation purposes. The paracentesis was performed. The catheter was removed and a dressing was applied. The patient tolerated the procedure well without immediate post procedural complication. FINDINGS: A total of approximately 7 L of yellow fluid was removed. IMPRESSION: Successful ultrasound-guided paracentesis yielding 7 liters of peritoneal fluid. Electronically Signed   By: Markus Daft M.D.   On: 03/10/2018 11:06    Assessment and Plan:   Jaydn Moscato is a 57 y.o. y/o male with alcoholic liver cirrhosis, abstinent since April 2019, last admitted in the April 2019 and began having paracentesis, with subsequent large per volume paracentesis since then  Meld-Na on today's labs is 8, which is improved from meld of 24 during hospital admission in April 2019 Sodium is improved to 138 We will increase Lasix to 40 mg daily and spinal lactone 200 mg daily Continue sodium restriction  Continue follow-up with nephrology due to history of hyponatremia, now improved  Continue alcohol abstinence No episodes of confusion since discharge Continue lactulose and titrate to goal of 2-3 soft problems daily  AFP normal in April 2019 Right upper quadrant ultrasound in April 2019 showed cirrhosis and ascites.  No reported liver lesions Viral hepatitis testing negative Ferritin Elevated during hospital stay, likely due to acute phase reactant We will repeat with  next labs  If patient continues to need large volume paracentesis despite increase in diuretics, will refer to IR for TIPS evaluation  We will need to schedule EGD for variceal screening after addressing fluid status and improvement of the same with increase in diuretics No history of GI bleed  Dr Vonda Antigua

## 2018-04-07 ENCOUNTER — Ambulatory Visit
Admission: RE | Admit: 2018-04-07 | Discharge: 2018-04-07 | Disposition: A | Discharge status: Home / Self Care | Payer: BLUE CROSS/BLUE SHIELD | Source: Ambulatory Visit | Attending: Family Medicine | Admitting: Family Medicine

## 2018-04-07 DIAGNOSIS — K7031 Alcoholic cirrhosis of liver with ascites: Secondary | ICD-10-CM | POA: Diagnosis not present

## 2018-04-07 MED ORDER — ALBUMIN HUMAN 25 % IV SOLN
INTRAVENOUS | Status: AC
  Administered 2018-04-07: 11:00:00
  Filled 2018-04-07: qty 100

## 2018-04-07 MED ORDER — ALBUMIN HUMAN 25 % IV SOLN: 25.0000 g | Freq: Once | INTRAVENOUS | Status: DC

## 2018-04-21 ENCOUNTER — Ambulatory Visit: Admission: RE | Admit: 2018-04-21 | Payer: BLUE CROSS/BLUE SHIELD | Source: Ambulatory Visit

## 2018-04-21 ENCOUNTER — Other Ambulatory Visit: Payer: Self-pay | Admitting: Family Medicine

## 2018-04-21 DIAGNOSIS — K7031 Alcoholic cirrhosis of liver with ascites: Secondary | ICD-10-CM

## 2018-04-22 ENCOUNTER — Other Ambulatory Visit: Payer: Self-pay

## 2018-04-22 ENCOUNTER — Telehealth: Payer: Self-pay

## 2018-04-22 MED ORDER — FUROSEMIDE 20 MG PO TABS: 40.0000 mg | ORAL_TABLET | Freq: Every day | ORAL | 0 refills | Status: DC

## 2018-04-22 MED ORDER — SPIRONOLACTONE 50 MG PO TABS: 100.0000 mg | ORAL_TABLET | Freq: Every day | ORAL | 0 refills | Status: DC

## 2018-04-22 NOTE — Telephone Encounter (Signed)
Patient returned call to [facility] to discuss lab results.  Patient has been informed that his sodium level is good at 138.  Advised to increase lasix to 2 daily and spironolactone to 2 daily as well.  He said he did  Not have anymore left.  New rxs for spironolactone and lasix have been sent to pharmacy.  Patient asked for Jackelyn Poling to give him a call back on Monday to discuss albumin levels.  I informed him that I could not answer questions regarding the albumin as I was not for sure about those results.  Thanks [employer]

## 2018-04-25 ENCOUNTER — Other Ambulatory Visit: Payer: Self-pay

## 2018-04-25 MED ORDER — SPIRONOLACTONE 50 MG PO TABS: 100.0000 mg | ORAL_TABLET | Freq: Every day | ORAL | 0 refills | Status: DC

## 2018-04-25 NOTE — Telephone Encounter (Signed)
I spoke with pt a length regarding lab work and he inquired about an amonia level that was elevated and I do not see that lab ordered. I did talk with Dr. Bonna Gains and she states that is not a lab that would change his medications but we could order it if he wishes. Pt states that he will get his PCP to do it because he can get the results quickly. Also had further questions about liver transplant and I suggested he make an appt to discuss this. He will do this at a later time.

## 2018-04-26 DIAGNOSIS — K7030 Alcoholic cirrhosis of liver without ascites: Secondary | ICD-10-CM

## 2018-04-27 ENCOUNTER — Other Ambulatory Visit: Payer: Self-pay | Admitting: Family Medicine

## 2018-04-27 ENCOUNTER — Ambulatory Visit
Admission: RE | Admit: 2018-04-27 | Discharge: 2018-04-27 | Disposition: A | Discharge status: Home / Self Care | Payer: BLUE CROSS/BLUE SHIELD | Source: Ambulatory Visit | Attending: Family Medicine | Admitting: Family Medicine

## 2018-04-27 ENCOUNTER — Other Ambulatory Visit
Admission: RE | Admit: 2018-04-27 | Discharge: 2018-04-27 | Disposition: A | Discharge status: Home / Self Care | Payer: BLUE CROSS/BLUE SHIELD | Source: Ambulatory Visit | Attending: Gastroenterology | Admitting: Gastroenterology

## 2018-04-27 DIAGNOSIS — K7030 Alcoholic cirrhosis of liver without ascites: Secondary | ICD-10-CM

## 2018-04-27 DIAGNOSIS — K7460 Unspecified cirrhosis of liver: Secondary | ICD-10-CM | POA: Insufficient documentation

## 2018-04-27 DIAGNOSIS — K7031 Alcoholic cirrhosis of liver with ascites: Secondary | ICD-10-CM

## 2018-04-27 DIAGNOSIS — R188 Other ascites: Secondary | ICD-10-CM | POA: Diagnosis not present

## 2018-04-27 LAB — AMMONIA: Ammonia: 16 umol/L (ref 9–35)

## 2018-04-27 MED ORDER — ALBUMIN HUMAN 25 % IV SOLN
25.0000 g | Freq: Once | INTRAVENOUS | Status: AC
  Administered 2018-04-27: 25 g via INTRAVENOUS

## 2018-04-27 MED ORDER — ALBUMIN HUMAN 25 % IV SOLN
INTRAVENOUS | Status: AC
  Filled 2018-04-27: qty 100

## 2018-04-28 ENCOUNTER — Other Ambulatory Visit: Payer: Self-pay

## 2018-04-28 MED ORDER — RIFAXIMIN 550 MG PO TABS: 550.0000 mg | ORAL_TABLET | Freq: Two times a day (BID) | ORAL | 1 refills | Status: DC

## 2018-04-28 MED ORDER — LACTULOSE 10 GM/15ML PO SOLN
10.0000 g | Freq: Three times a day (TID) | ORAL | 1 refills | Status: DC
Start: 2018-04-28 — End: 2018-06-02

## 2018-05-05 ENCOUNTER — Ambulatory Visit: Payer: BLUE CROSS/BLUE SHIELD

## 2018-05-12 ENCOUNTER — Other Ambulatory Visit: Payer: Self-pay | Admitting: Family Medicine

## 2018-05-12 ENCOUNTER — Ambulatory Visit
Admission: RE | Admit: 2018-05-12 | Discharge: 2018-05-12 | Disposition: A | Discharge status: Home / Self Care | Payer: BLUE CROSS/BLUE SHIELD | Source: Ambulatory Visit | Attending: Family Medicine | Admitting: Family Medicine

## 2018-05-12 DIAGNOSIS — K7031 Alcoholic cirrhosis of liver with ascites: Secondary | ICD-10-CM

## 2018-05-12 DIAGNOSIS — N62 Hypertrophy of breast: Secondary | ICD-10-CM | POA: Insufficient documentation

## 2018-05-14 ENCOUNTER — Other Ambulatory Visit: Payer: Self-pay | Admitting: Gastroenterology

## 2018-05-20 NOTE — Discharge Instructions (Signed)
Paracentesis, Care After °Refer to this sheet in the next few weeks. These instructions provide you with information about caring for yourself after your procedure. Your health care provider may also give you more specific instructions. Your treatment has been planned according to current medical practices, but problems sometimes occur. Call your health care provider if you have any problems or questions after your procedure. °What can I expect after the procedure? °After your procedure, it is common to have a small amount of clear fluid coming from the puncture site. °Follow these instructions at home: °· Return to your normal activities as told by your health care provider. Ask your health care provider what activities are safe for you. °· Take over-the-counter and prescription medicines only as told by your health care provider. °· Do not take baths, swim, or use a hot tub until your health care provider approves. °· Follow instructions from your health care provider about: °? How to take care of your puncture site. °? When and how you should change your bandage (dressing). °? When you should remove your dressing. °· Check your puncture area every day signs of infection. Watch for: °? Redness, swelling, or pain. °? Fluid, blood, or pus. °· Keep all follow-up visits as told by your health care provider. This is important. °Contact a health care provider if: °· You have redness, swelling, or pain at your puncture site. °· You start to have more clear fluid coming from your puncture site. °· You have blood or pus coming from your puncture site. °· You have chills. °· You have a fever. °Get help right away if: °· You develop chest pain or shortness of breath. °· You develop increasing pain, discomfort, or swelling in your abdomen. °· You feel dizzy or light-headed or you pass out. °This information is not intended to replace advice given to you by your health care provider. Make sure you discuss any questions you  have with your health care provider. °Document Released: 01/15/2015 Document Revised: 02/06/2016 Document Reviewed: 11/13/2014 °Elsevier Interactive Patient Education © 2018 [employer] ° °

## 2018-05-21 ENCOUNTER — Other Ambulatory Visit: Payer: Self-pay | Admitting: Gastroenterology

## 2018-05-23 ENCOUNTER — Other Ambulatory Visit: Payer: Self-pay | Admitting: Family Medicine

## 2018-05-23 DIAGNOSIS — K7031 Alcoholic cirrhosis of liver with ascites: Secondary | ICD-10-CM

## 2018-05-26 ENCOUNTER — Ambulatory Visit
Admission: RE | Admit: 2018-05-26 | Discharge: 2018-05-26 | Disposition: A | Discharge status: Home / Self Care | Payer: BLUE CROSS/BLUE SHIELD | Source: Ambulatory Visit | Attending: Family Medicine | Admitting: Family Medicine

## 2018-06-02 ENCOUNTER — Ambulatory Visit (INDEPENDENT_AMBULATORY_CARE_PROVIDER_SITE_OTHER): Payer: BLUE CROSS/BLUE SHIELD | Admitting: Gastroenterology

## 2018-06-02 ENCOUNTER — Encounter: Payer: Self-pay | Admitting: Gastroenterology

## 2018-06-02 VITALS — BP 102/64 | HR 92 | Ht 72.0 in | Wt 214.8 lb

## 2018-06-02 DIAGNOSIS — R2689 Other abnormalities of gait and mobility: Secondary | ICD-10-CM | POA: Insufficient documentation

## 2018-06-02 DIAGNOSIS — R296 Repeated falls: Secondary | ICD-10-CM | POA: Insufficient documentation

## 2018-06-02 DIAGNOSIS — K7030 Alcoholic cirrhosis of liver without ascites: Secondary | ICD-10-CM

## 2018-06-02 DIAGNOSIS — Z23 Encounter for immunization: Secondary | ICD-10-CM

## 2018-06-02 NOTE — Patient Instructions (Addendum)
F/U 2 months in clinic  Nurse visit in 1 month for 2nd TwinRix injection Decrease Lasix 20mg  to daily Decrease Spirolactone 50mg  to daily

## 2018-06-03 ENCOUNTER — Telehealth: Payer: Self-pay

## 2018-06-03 ENCOUNTER — Other Ambulatory Visit: Payer: Self-pay

## 2018-06-03 DIAGNOSIS — I864 Gastric varices: Secondary | ICD-10-CM

## 2018-06-03 DIAGNOSIS — Z1211 Encounter for screening for malignant neoplasm of colon: Secondary | ICD-10-CM

## 2018-06-03 LAB — PROTIME-INR
INR: 1.1 (ref 0.8–1.2)
Prothrombin Time: 11.2 s (ref 9.1–12.0)

## 2018-06-03 NOTE — Telephone Encounter (Signed)
Pt notified of lab results

## 2018-06-03 NOTE — Progress Notes (Signed)
Gabriel Antigua, MD [address]  [place]  [place], [place] [address]  Main: [phone]  Fax: [phone]   Primary Care Physician: Gennaro Africa, MD  Primary Gastroenterologist:  Dr. Vonda Gray  Chief Complaint  Patient presents with  . Follow-up    cirrhosis of liver    HPI: Gabriel Gray is a 57 y.o. 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  Used to drink about 1-1/2 L of liquor once a week.  Has not had any alcohol since April 2019.  Has improved very well.  Abdominal distention has resolved.  Reports good appetite.  No melena or hematochezia.  No confusion or episodes of bleeding.  Is taking Lasix and spironolactone.  Last paracentesis, April 27, 2018 and 3.4 L removed.  No history of SBP.  Previous history:  Patient was admitted in the hospital in April 2019, and was in rehab after that.  History of alcoholic cirrhosis.  Has not had any alcohol since last hospital admission April 2019 as per patient.  Drank heavily for 7 to 8 years, daily, and was drinking only on weekends just prior to presentation to the hospital.  He was discharged on Lasix and spironolactone daily that he has been taking.  He has been receiving large volume paracentesis through the rehab facility.    April 27, 2018, 3.4 L removed April 07, 2018, 5.3 L removed March 24, 2018, 5.8 L removed March 10, 2018, 7 L removed February 24, 2018, 7 L removed Feb 11, 2018, 5.7 L removed Feb 03, 2018, 6 L removed Jan 20, 2018, 4.5 L removed January 03, 2018, 5 L removed  Fluid studies during the hospital admission were negative for SBP.  Cytology did not show any malignant cells.  Albumin was less than 1 on peritoneal fluid.  Serum albumin is not available from day of paracentesis, but based on albumin done 2 days prior to the paracentesis, SAAG is more than 1.1, and consistent with portal hypertension.  Current Outpatient Medications    Medication Sig Dispense Refill  . aspirin (ECOTRIN LOW STRENGTH) 81 MG EC tablet Take 1 tablet by mouth daily.    . B Complex Vitamins (VITAMIN B COMPLEX PO) Take 1 tablet by mouth daily.    Marland Kitchen buPROPion (WELLBUTRIN XL) 300 MG 24 hr tablet Take 1 tablet by mouth daily.    . furosemide (LASIX) 20 MG tablet Take 20 mg by mouth daily.    Marland Kitchen levothyroxine (SYNTHROID, LEVOTHROID) 50 MCG tablet Take 1 tablet by mouth daily.    . ondansetron (ZOFRAN-ODT) 8 MG disintegrating tablet Take 1 tablet by mouth 3 (three) times daily as needed.  0  . spironolactone (ALDACTONE) 50 MG tablet Take 50 mg by mouth daily.    . traZODone (DESYREL) 50 MG tablet TAKE 2 TABLETS (100 MG TOTAL) BY MOUTH NIGHTLY.  5  . XIFAXAN 550 MG TABS tablet TAKE 1 TABLET (550 MG TOTAL) BY MOUTH 2 (TWO) TIMES DAILY. 60 tablet 1   No current facility-administered medications for this visit.     Allergies as of 06/02/2018  . (No Known Allergies)    ROS:  General: Negative for anorexia, weight loss, fever, chills, fatigue, weakness. ENT: Negative for hoarseness, difficulty swallowing , nasal congestion. CV: Negative for chest pain, angina, palpitations, dyspnea on exertion, peripheral edema.  Respiratory: Negative for dyspnea at rest, dyspnea on exertion, cough, sputum, wheezing.  GI: See history of present illness. GU:  Negative for dysuria, hematuria, urinary incontinence, urinary frequency, nocturnal urination.  Endo: Negative for unusual weight change.    Physical Examination:   BP 102/64   Pulse 92   Ht 6' (1.829 m)   Wt 214 lb 12.8 oz (97.4 kg)   BMI 29.13 kg/m   General: Well-nourished, well-developed in no acute distress.  Eyes: No icterus. Conjunctivae pink. Mouth: Oropharyngeal mucosa moist and pink , no lesions erythema or exudate. Neck: Supple, Trachea midline Abdomen: Bowel sounds are normal, nontender, nondistended, no hepatosplenomegaly or masses, no abdominal bruits or hernia , no rebound or guarding.    Extremities: No lower extremity edema. No clubbing or deformities. Neuro: Alert and oriented x 3.  Grossly intact. Skin: Warm and dry, no jaundice.   Psych: Alert and cooperative, normal mood and affect.   Labs: CMP     Component Value Date/Time   NA 143 06/02/2018 1620   K 4.0 06/02/2018 1620   CL 104 06/02/2018 1620   CO2 26 06/02/2018 1620   GLUCOSE 117 (H) 06/02/2018 1620   GLUCOSE 115 (H) 04/05/2018 1452   BUN 15 06/02/2018 1620   CREATININE 0.89 06/02/2018 1620   CALCIUM 9.1 06/02/2018 1620   PROT 4.9 (L) 06/02/2018 1620   ALBUMIN 3.4 (L) 06/02/2018 1620   AST 18 06/02/2018 1620   ALT 13 06/02/2018 1620   ALKPHOS 116 06/02/2018 1620   BILITOT 0.6 06/02/2018 1620   GFRNONAA 94 06/02/2018 1620   GFRAA 108 06/02/2018 1620   Lab Results  Component Value Date   WBC 11.5 (H) 06/02/2018   HGB 12.8 (L) 06/02/2018   HCT 38.7 06/02/2018   MCV 91 06/02/2018   PLT 159 06/02/2018    Imaging Studies: US Abdomen Limited  Result Date: 05/12/2018 CLINICAL DATA:  Cirrhosis and ascites. Most recent paracentesis procedure on 04/27/2018. Recent increase in diuretic dosing. EXAM: LIMITED ABDOMEN ULTRASOUND FOR ASCITES TECHNIQUE: Limited ultrasound survey for ascites was performed in all four abdominal quadrants. COMPARISON:  04/27/2018 FINDINGS: Ultrasound today essentially shows no ascites in the peritoneal cavity. There is a trace amount of fluid around the liver and a tiny amount of fluid noted in between some small bowel loops. IMPRESSION: Substantial decrease in accumulation of ascites since the most recent paracentesis procedure with only a trace amount of fluid seen around the liver and in between small bowel loops. Paracentesis was not necessary today. Electronically Signed   By: Aletta Edouard M.D.   On: 05/12/2018 10:31    Assessment and Plan:   Gabriel Gray is a 57 y.o. y/o male here for follow-up of alcoholic cirrhosis, last drink April 2019, last paracentesis,  April 27, 2018, with no further abdominal distention, no lower extremity swelling, no episodes of bleeding or confusion, no history of SBP  Meld NA score of 7 on today's labs, improved from meld score of 24 during hospital admission in April 2019 Sodium improved, hyponatremia resolved We will decrease Lasix to 20 mg daily from 40 mg daily, and decrease spironolactone to 50 mg daily.  The decrease is since patient does not have any abdominal distention or lower extremity swelling anymore  Right upper quadrant ultrasound up-to-date in April 2019, showed cirrhosis and ascites and no liver lesions Viral hepatitis testing negative Ferritin elevated in April 2019 during hospital admission, due to acute phase reactant, we will repeat with next labs  Patient had previous orders for repeat paracentesis as needed by his rehab physician.  I have asked patient to not go  to get his paracentesis done without informing us first.  He verbalized understanding along with his wife as well.  We will try to call the ultrasound staff to cancel his existing orders for paracentesis.  We will schedule for EGD for variceal screening and colonoscopy for clinical cancer screening as well  Continue alcohol abstinence Patient reports loose stools with lactulose and is having to wear diapers No episodes of confusion, will therefore discontinue lactulose, he is on rifaximin as well  Hep A and B vaccination today Patient was asked to follow-up with his primary care provider and ensure all of his vaccinations such as flu shots and pneumonia vaccines are up-to-date.  Extensive amount of time spent counseling the patient, answering his questions, going over his medication changes.   Dr Gabriel Gray

## 2018-06-03 NOTE — Telephone Encounter (Signed)
-----   Message from Virgel Manifold, MD sent at 06/03/2018  8:27 AM EDT ----- Jackelyn Poling please let patient know, his labwork looks good. Sodium is normal. His liver enzymes are normal. His albumin is just below normal and that improves with eating better. Continue with EGD and colonoscopy as planned.

## 2018-06-04 LAB — COMPREHENSIVE METABOLIC PANEL
A/G RATIO: 2.3 — AB (ref 1.2–2.2)
ALBUMIN: 3.4 g/dL — AB (ref 3.5–5.5)
ALT: 13 IU/L (ref 0–44)
AST: 18 IU/L (ref 0–40)
Alkaline Phosphatase: 116 IU/L (ref 39–117)
BUN / CREAT RATIO: 17 (ref 9–20)
BUN: 15 mg/dL (ref 6–24)
Bilirubin Total: 0.6 mg/dL (ref 0.0–1.2)
CALCIUM: 9.1 mg/dL (ref 8.7–10.2)
CO2: 26 mmol/L (ref 20–29)
CREATININE: 0.89 mg/dL (ref 0.76–1.27)
Chloride: 104 mmol/L (ref 96–106)
GFR calc Af Amer: 108 mL/min/{1.73_m2} (ref >59)
GFR, EST NON AFRICAN AMERICAN: 94 mL/min/{1.73_m2} (ref >59)
GLOBULIN, TOTAL: 1.5 g/dL (ref 1.5–4.5)
Glucose: 117 mg/dL — ABNORMAL HIGH (ref 65–99)
Potassium: 4 mmol/L (ref 3.5–5.2)
Sodium: 143 mmol/L (ref 134–144)
Total Protein: 4.9 g/dL — ABNORMAL LOW (ref 6.0–8.5)

## 2018-06-04 LAB — CBC
HEMATOCRIT: 38.7 % (ref 37.5–51.0)
HEMOGLOBIN: 12.8 g/dL — AB (ref 13.0–17.7)
MCH: 30.2 pg (ref 26.6–33.0)
MCHC: 33.1 g/dL (ref 31.5–35.7)
MCV: 91 fL (ref 79–97)
Platelets: 159 10*3/uL (ref 150–450)
RBC: 4.24 x10E6/uL (ref 4.14–5.80)
RDW: 14.3 % (ref 12.3–15.4)
WBC: 11.5 10*3/uL — AB (ref 3.4–10.8)

## 2018-06-04 LAB — ALDOLASE: Aldolase: 4.4 U/L (ref 3.3–10.3)

## 2018-06-04 LAB — CK: Total CK: 26 U/L (ref 24–204)

## 2018-06-08 ENCOUNTER — Other Ambulatory Visit: Payer: Self-pay | Admitting: Neurology

## 2018-06-08 DIAGNOSIS — R2689 Other abnormalities of gait and mobility: Secondary | ICD-10-CM

## 2018-06-08 DIAGNOSIS — R29898 Other symptoms and signs involving the musculoskeletal system: Secondary | ICD-10-CM

## 2018-06-09 ENCOUNTER — Ambulatory Visit: Admission: RE | Admit: 2018-06-09 | Payer: BLUE CROSS/BLUE SHIELD | Source: Ambulatory Visit

## 2018-06-14 ENCOUNTER — Other Ambulatory Visit: Payer: Self-pay | Admitting: Gastroenterology

## 2018-06-15 ENCOUNTER — Other Ambulatory Visit: Payer: Self-pay | Admitting: Gastroenterology

## 2018-06-16 ENCOUNTER — Other Ambulatory Visit: Payer: Self-pay | Admitting: Gastroenterology

## 2018-06-21 ENCOUNTER — Other Ambulatory Visit: Payer: Self-pay | Admitting: Family Medicine

## 2018-06-21 DIAGNOSIS — K7031 Alcoholic cirrhosis of liver with ascites: Secondary | ICD-10-CM

## 2018-06-23 ENCOUNTER — Ambulatory Visit
Admission: RE | Admit: 2018-06-23 | Discharge: 2018-06-23 | Disposition: A | Discharge status: Home / Self Care | Payer: BLUE CROSS/BLUE SHIELD | Source: Ambulatory Visit | Attending: Family Medicine | Admitting: Family Medicine

## 2018-06-23 ENCOUNTER — Ambulatory Visit
Admission: RE | Admit: 2018-06-23 | Discharge: 2018-06-23 | Disposition: A | Discharge status: Home / Self Care | Payer: BLUE CROSS/BLUE SHIELD | Source: Ambulatory Visit | Attending: Neurology | Admitting: Neurology

## 2018-06-23 ENCOUNTER — Other Ambulatory Visit: Payer: Self-pay | Admitting: Family Medicine

## 2018-06-23 DIAGNOSIS — R29898 Other symptoms and signs involving the musculoskeletal system: Secondary | ICD-10-CM | POA: Insufficient documentation

## 2018-06-23 DIAGNOSIS — K7030 Alcoholic cirrhosis of liver without ascites: Secondary | ICD-10-CM | POA: Insufficient documentation

## 2018-06-23 DIAGNOSIS — R2689 Other abnormalities of gait and mobility: Secondary | ICD-10-CM | POA: Diagnosis present

## 2018-06-23 DIAGNOSIS — K7031 Alcoholic cirrhosis of liver with ascites: Secondary | ICD-10-CM

## 2018-06-23 DIAGNOSIS — R296 Repeated falls: Secondary | ICD-10-CM | POA: Diagnosis not present

## 2018-06-23 DIAGNOSIS — R42 Dizziness and giddiness: Secondary | ICD-10-CM | POA: Insufficient documentation

## 2018-06-23 DIAGNOSIS — I771 Stricture of artery: Secondary | ICD-10-CM | POA: Insufficient documentation

## 2018-06-23 MED ORDER — ALBUMIN HUMAN 25 % IV SOLN
INTRAVENOUS | Status: AC
  Filled 2018-06-23: qty 100

## 2018-06-23 MED ORDER — ALBUMIN HUMAN 25 % IV SOLN: 25.0000 g | Freq: Once | INTRAVENOUS | Status: DC

## 2018-06-30 ENCOUNTER — Ambulatory Visit (INDEPENDENT_AMBULATORY_CARE_PROVIDER_SITE_OTHER): Payer: BLUE CROSS/BLUE SHIELD | Admitting: Gastroenterology

## 2018-06-30 VITALS — Wt 223.4 lb

## 2018-06-30 DIAGNOSIS — Z23 Encounter for immunization: Secondary | ICD-10-CM | POA: Diagnosis not present

## 2018-06-30 DIAGNOSIS — K7030 Alcoholic cirrhosis of liver without ascites: Secondary | ICD-10-CM | POA: Diagnosis not present

## 2018-07-01 ENCOUNTER — Ambulatory Visit: Payer: BLUE CROSS/BLUE SHIELD

## 2018-07-04 ENCOUNTER — Telehealth: Payer: Self-pay | Admitting: Gastroenterology

## 2018-07-04 NOTE — Telephone Encounter (Signed)
PATIENT'S WIFE CALLED & STATES SHE WOULD LIKE TO RESCHEDULE THE COLONOSCOPY SCHEDULED FOR 07-06-18 & WOULD LIKE TO RESCHEDULE.PLEASE CALL WIFE DIANE AT [phone].

## 2018-07-04 NOTE — Telephone Encounter (Signed)
Pt wife is calling to reschedule procedure pt is currently sick please call pt

## 2018-07-04 NOTE — Telephone Encounter (Signed)
I spoke with pt's wife and will reschedule EGD/colonoscopy to 08/03/2018. Pt has diagnosis of Shingles to the point he has difficulty talking, sleeping.

## 2018-07-07 ENCOUNTER — Ambulatory Visit: Admission: RE | Admit: 2018-07-07 | Payer: BLUE CROSS/BLUE SHIELD | Source: Ambulatory Visit

## 2018-07-21 ENCOUNTER — Ambulatory Visit: Payer: BLUE CROSS/BLUE SHIELD | Admitting: Gastroenterology

## 2018-07-21 ENCOUNTER — Ambulatory Visit: Admission: RE | Admit: 2018-07-21 | Payer: BLUE CROSS/BLUE SHIELD | Source: Ambulatory Visit

## 2018-07-27 ENCOUNTER — Encounter: Admission: RE | Payer: Self-pay | Source: Ambulatory Visit

## 2018-07-27 ENCOUNTER — Ambulatory Visit
Admission: RE | Admit: 2018-07-27 | Payer: BLUE CROSS/BLUE SHIELD | Source: Ambulatory Visit | Admitting: Gastroenterology

## 2018-07-27 ENCOUNTER — Telehealth: Payer: Self-pay

## 2018-07-27 SURGERY — COLONOSCOPY WITH PROPOFOL
Anesthesia: General

## 2018-07-27 NOTE — Telephone Encounter (Signed)
LMTCO to reschedule EGD/Colonoscopy. Was on the schedule for 07/27/18 but should have been 11/20 and when [facility] contacted pt/wife she states pt wanted to reschedule 11/20 appt.

## 2018-07-28 NOTE — Telephone Encounter (Signed)
[facility]MT[facility] to reschedule EGD/Colonoscopy and that I had left his wife Peter Congo) a message yesterday of whom originally called.

## 2018-08-03 ENCOUNTER — Other Ambulatory Visit: Payer: Self-pay | Admitting: Family Medicine

## 2018-08-03 DIAGNOSIS — K7031 Alcoholic cirrhosis of liver with ascites: Secondary | ICD-10-CM

## 2018-08-04 ENCOUNTER — Ambulatory Visit: Payer: BLUE CROSS/BLUE SHIELD

## 2018-08-14 ENCOUNTER — Other Ambulatory Visit: Payer: Self-pay | Admitting: Gastroenterology

## 2018-08-17 ENCOUNTER — Other Ambulatory Visit: Payer: Self-pay | Admitting: Family Medicine

## 2018-08-17 DIAGNOSIS — K7031 Alcoholic cirrhosis of liver with ascites: Secondary | ICD-10-CM

## 2018-08-18 ENCOUNTER — Ambulatory Visit: Payer: BLUE CROSS/BLUE SHIELD

## 2018-09-01 ENCOUNTER — Ambulatory Visit: Payer: BLUE CROSS/BLUE SHIELD

## 2018-09-05 NOTE — Telephone Encounter (Signed)
[facility]MT[facility] to schedule EGD/Colonoscopy.

## 2018-09-10 ENCOUNTER — Other Ambulatory Visit: Payer: Self-pay | Admitting: Gastroenterology

## 2018-10-06 ENCOUNTER — Other Ambulatory Visit: Payer: Self-pay | Admitting: Gastroenterology

## 2018-11-01 ENCOUNTER — Other Ambulatory Visit: Payer: Self-pay | Admitting: Gastroenterology

## 2018-11-04 ENCOUNTER — Other Ambulatory Visit: Payer: Self-pay | Admitting: Gastroenterology

## 2018-11-08 ENCOUNTER — Telehealth: Payer: Self-pay | Admitting: Gastroenterology

## 2018-11-08 DIAGNOSIS — Z1211 Encounter for screening for malignant neoplasm of colon: Secondary | ICD-10-CM

## 2018-11-08 DIAGNOSIS — Z8719 Personal history of other diseases of the digestive system: Secondary | ICD-10-CM

## 2018-11-08 NOTE — Telephone Encounter (Signed)
Patient's wife called stating patient needs to have a colonoscopy & upper endoscopy scheduled mid of month would be best.

## 2018-11-11 ENCOUNTER — Telehealth: Payer: Self-pay

## 2018-11-11 ENCOUNTER — Other Ambulatory Visit: Payer: Self-pay

## 2018-11-11 DIAGNOSIS — Z1211 Encounter for screening for malignant neoplasm of colon: Secondary | ICD-10-CM

## 2018-11-11 DIAGNOSIS — Z8719 Personal history of other diseases of the digestive system: Secondary | ICD-10-CM

## 2018-11-11 MED ORDER — NA SULFATE-K SULFATE-MG SULF 17.5-3.13-1.6 GM/177ML PO SOLN: 1.0000 | Freq: Once | ORAL | 0 refills | Status: AC

## 2018-11-11 NOTE — Telephone Encounter (Signed)
Scheduled for 11/30/2018.

## 2018-11-11 NOTE — Telephone Encounter (Signed)
I spoke with wife and scheduled pt's colonoscopy and EGD for 11/30/2018. Will send prep instructions via my chart. Will send rx to pharmacy.

## 2018-11-11 NOTE — Telephone Encounter (Signed)
Colonoscopy prep instructions sent via my chart. Wife is aware.

## 2018-11-14 ENCOUNTER — Telehealth: Payer: Self-pay | Admitting: Gastroenterology

## 2018-11-14 NOTE — Telephone Encounter (Signed)
Pt asked to reschedule from 3/18 to 3/19. Left message that this was okay. Will notify Trish at [facility].

## 2018-11-14 NOTE — Telephone Encounter (Signed)
Pt wife is calling for Jackelyn Poling  to see if we can switch pt date from 11/30/18 to 12/01/18 please return her call to confirm

## 2018-11-18 NOTE — Telephone Encounter (Signed)
Pt to be seen in clinic prior to further refills

## 2018-11-23 NOTE — Telephone Encounter (Signed)
Sent pt my message that an appt in clinic was required to get refill.

## 2018-11-25 NOTE — Telephone Encounter (Signed)
Left vm for pt to call office and schedule apt  °

## 2018-11-25 NOTE — Telephone Encounter (Signed)
-----   Message from Martie Lee, LPN sent at 8/32/5498  3:20 PM EDT ----- Regarding: f/u appt.Gabriel Gray medication Pt would like to schedule appt with Dr Bonna Gains.  If you have anything in the morning in Apr 16 that would be great as I have already have that day off for two appts he has in the afternoon.  However we are open to other dates.  Thanks, [employer]

## 2018-12-01 ENCOUNTER — Ambulatory Visit: Admit: 2018-12-01 | Payer: BLUE CROSS/BLUE SHIELD | Admitting: Gastroenterology

## 2018-12-01 SURGERY — COLONOSCOPY WITH PROPOFOL
Anesthesia: General

## 2019-01-23 IMAGING — US US PARACENTESIS
1 series · 9 of 9 positions shown · non-contrast
Comparison: none

INDICATION: Alcoholic cirrhosis of liver with ascites.

[Series 1: us paracentesis · 9 of 9 slices shown]
[im 1/9]
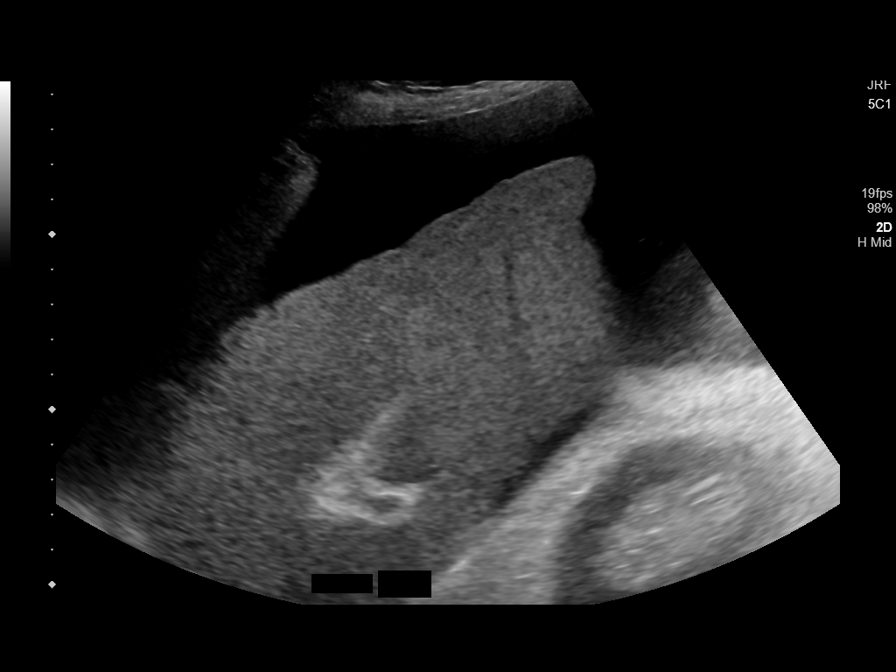
[im 2/9]
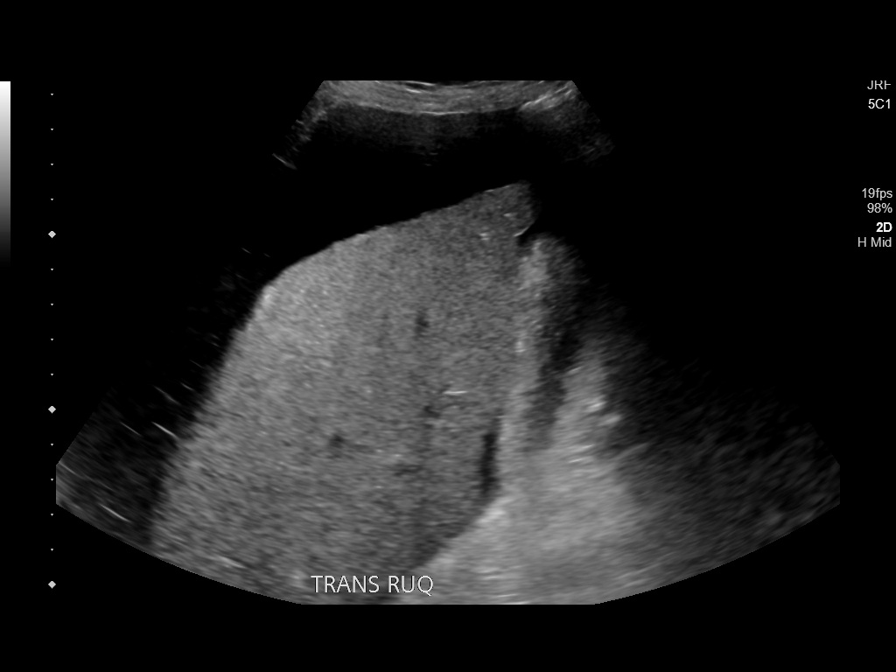
[im 3/9]
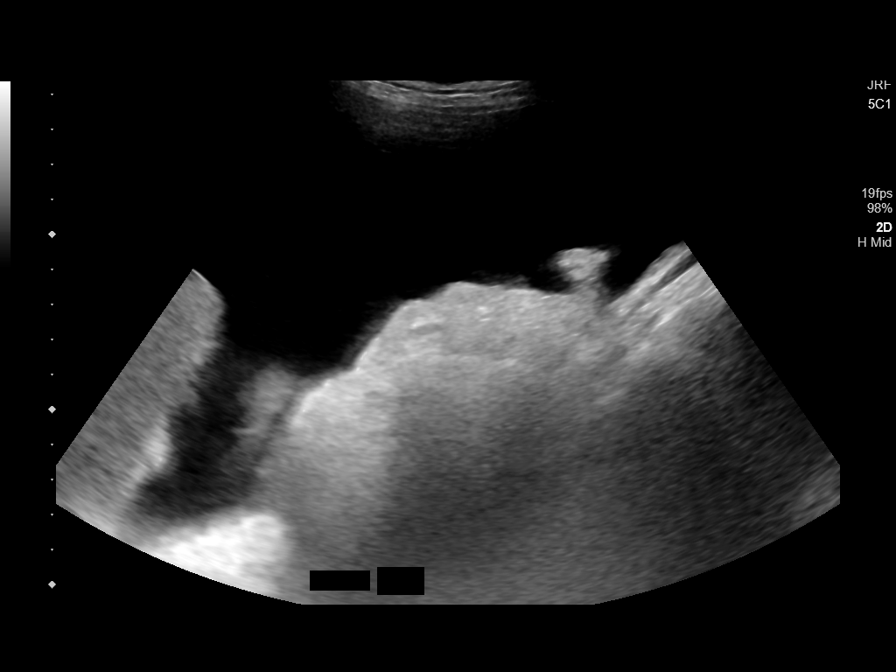
[im 4/9]
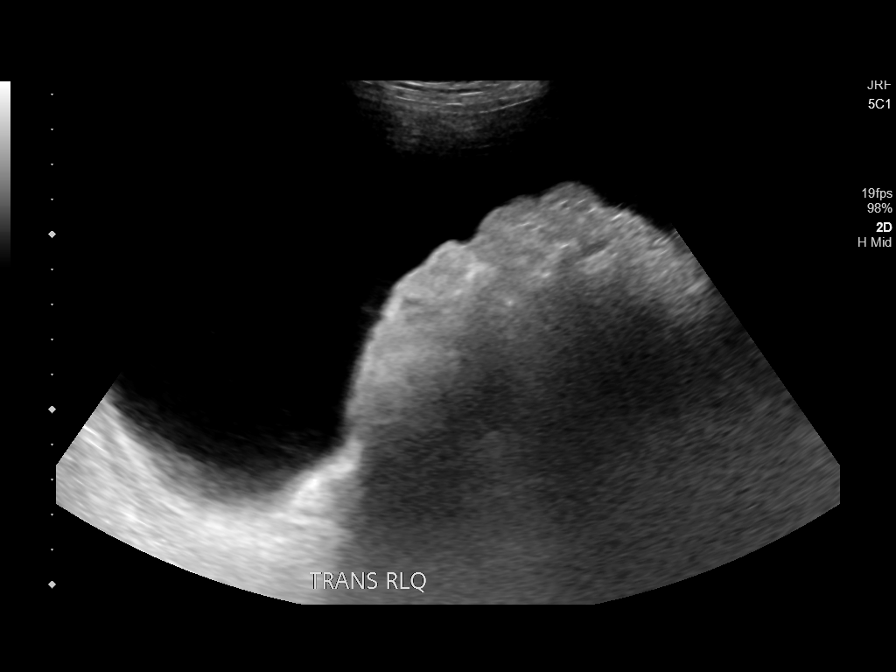
[im 5/9]
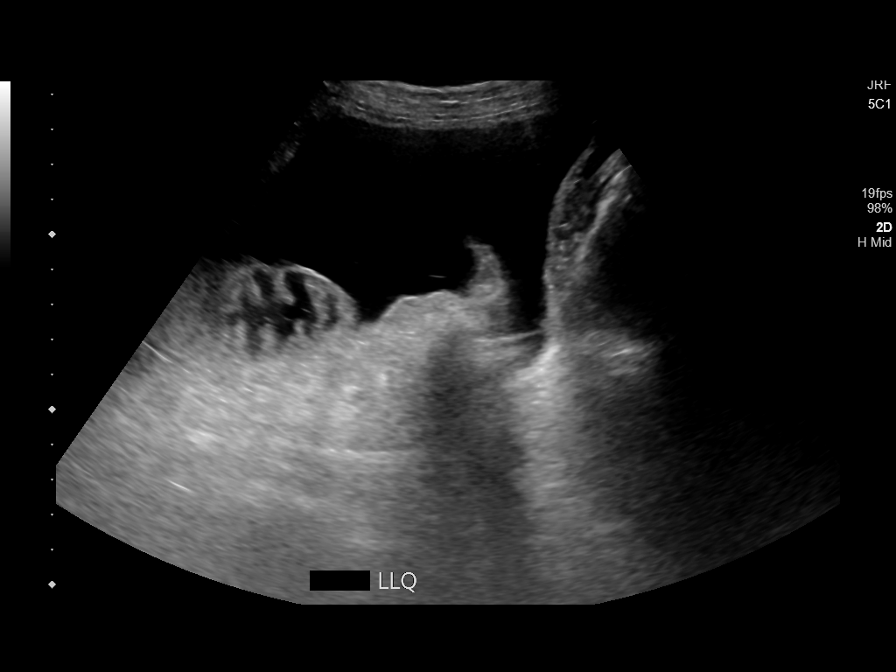
[im 6/9]
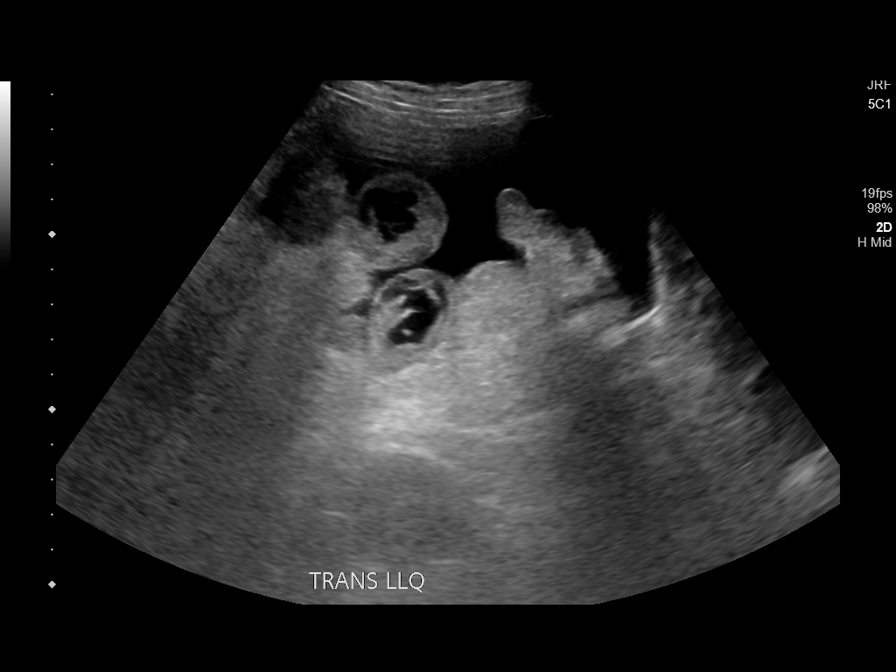
[im 7/9]
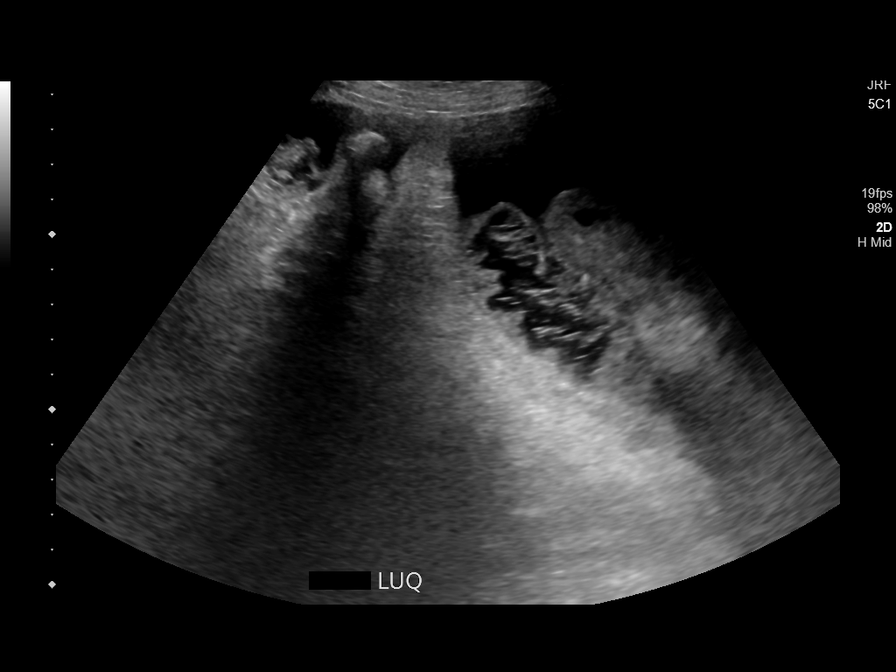
[im 8/9]
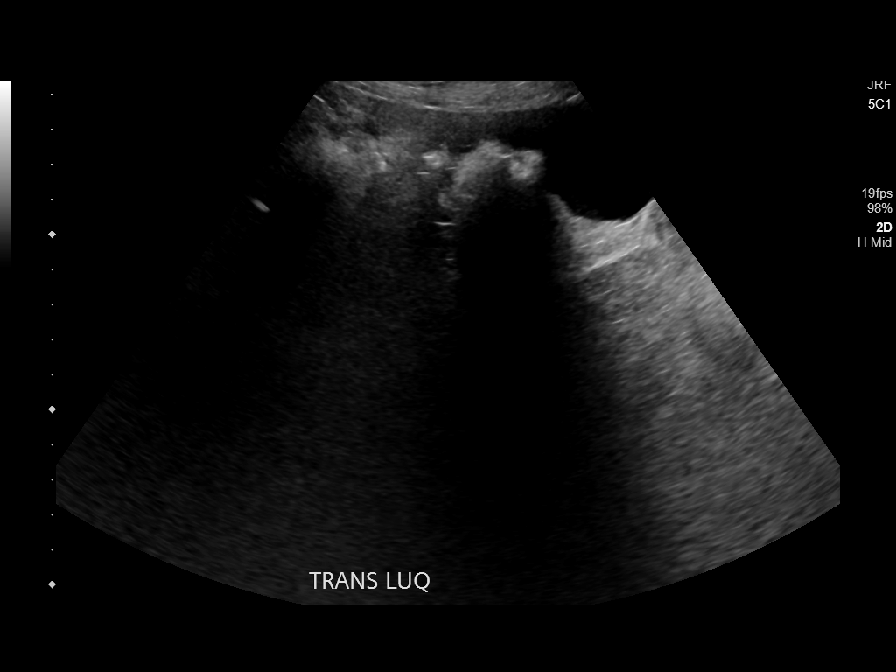
[im 9/9]
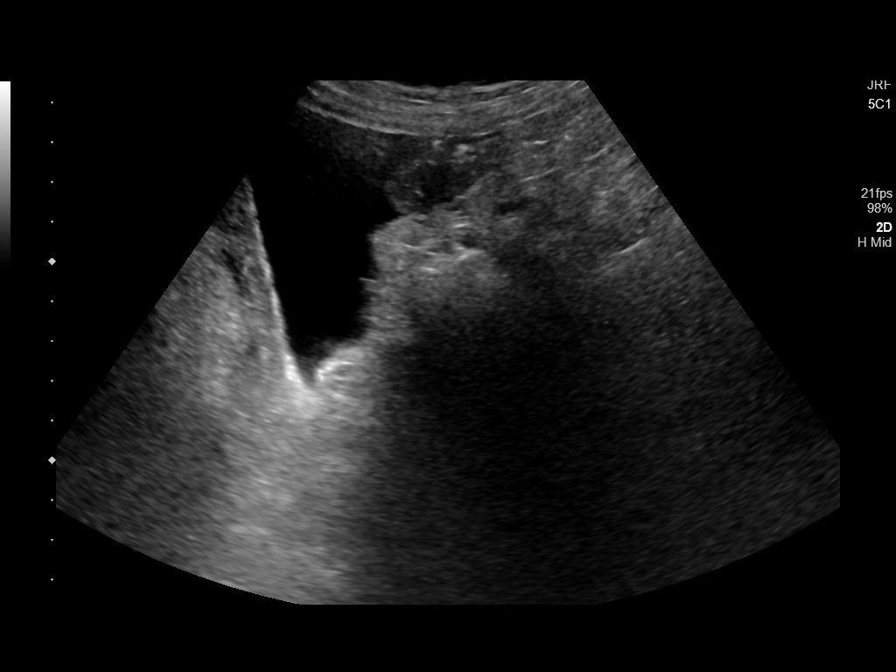

[9 of 9 positions shown; findings below may reference images not displayed]

EXAM:
ULTRASOUND GUIDED THERAPEUTIC PARACENTESIS

MEDICATIONS:
None.

COMPLICATIONS:
None immediate.

PROCEDURE:
Informed written consent was obtained from the patient after a
discussion of the risks, benefits and alternatives to treatment. A
timeout was performed prior to the initiation of the procedure.

Initial ultrasound scanning demonstrates a large amount of ascites
within the right lower abdominal quadrant. The right lower abdomen
was prepped and draped in the usual sterile fashion. 1% lidocaine
was used for local anesthesia.

Following this, a 6 Fr Safe-T-Centesis catheter was introduced. An
ultrasound image was saved for documentation purposes. The
paracentesis was performed. The catheter was removed and a dressing
was applied. The patient tolerated the procedure well without
immediate post procedural complication.
FINDINGS: A total of approximately 4.5 L of yellow fluid was removed. Catheter
stopped draining although there was a small amount of fluid
remaining.
IMPRESSION: Successful ultrasound-guided paracentesis yielding 4.5 liters of
peritoneal fluid.

## 2019-01-30 ENCOUNTER — Emergency Department: Payer: BLUE CROSS/BLUE SHIELD

## 2019-01-30 ENCOUNTER — Emergency Department
Admission: EM | Admit: 2019-01-30 | Discharge: 2019-01-31 | Disposition: A | Discharge status: Other Acute Inpatient Hospital | Payer: BLUE CROSS/BLUE SHIELD | Attending: Emergency Medicine | Admitting: Emergency Medicine

## 2019-01-30 ENCOUNTER — Other Ambulatory Visit: Payer: Self-pay

## 2019-01-30 DIAGNOSIS — E878 Other disorders of electrolyte and fluid balance, not elsewhere classified: Secondary | ICD-10-CM | POA: Diagnosis not present

## 2019-01-30 DIAGNOSIS — E871 Hypo-osmolality and hyponatremia: Secondary | ICD-10-CM | POA: Diagnosis not present

## 2019-01-30 DIAGNOSIS — Z79899 Other long term (current) drug therapy: Secondary | ICD-10-CM | POA: Insufficient documentation

## 2019-01-30 DIAGNOSIS — Z7982 Long term (current) use of aspirin: Secondary | ICD-10-CM | POA: Insufficient documentation

## 2019-01-30 DIAGNOSIS — C9590 Leukemia, unspecified not having achieved remission: Secondary | ICD-10-CM

## 2019-01-30 DIAGNOSIS — R748 Abnormal levels of other serum enzymes: Secondary | ICD-10-CM | POA: Diagnosis not present

## 2019-01-30 DIAGNOSIS — R531 Weakness: Secondary | ICD-10-CM

## 2019-01-30 DIAGNOSIS — I10 Essential (primary) hypertension: Secondary | ICD-10-CM | POA: Diagnosis not present

## 2019-01-30 DIAGNOSIS — D72829 Elevated white blood cell count, unspecified: Secondary | ICD-10-CM

## 2019-01-30 DIAGNOSIS — R110 Nausea: Secondary | ICD-10-CM | POA: Diagnosis present

## 2019-01-30 DIAGNOSIS — Z1159 Encounter for screening for other viral diseases: Secondary | ICD-10-CM | POA: Diagnosis not present

## 2019-01-30 DIAGNOSIS — I2510 Atherosclerotic heart disease of native coronary artery without angina pectoris: Secondary | ICD-10-CM | POA: Diagnosis not present

## 2019-01-30 DIAGNOSIS — K7030 Alcoholic cirrhosis of liver without ascites: Secondary | ICD-10-CM | POA: Diagnosis not present

## 2019-01-30 HISTORY — DX: Unspecified cirrhosis of liver: K74.60

## 2019-01-30 LAB — CBC WITH DIFFERENTIAL/PLATELET
Abs Immature Granulocytes: 0.68 10*3/uL — ABNORMAL HIGH (ref 0.00–0.07)
Basophils Absolute: 0.3 10*3/uL — ABNORMAL HIGH (ref 0.0–0.1)
Basophils Relative: 0 %
Eosinophils Absolute: 0 10*3/uL (ref 0.0–0.5)
Eosinophils Relative: 0 %
HCT: 41.4 % (ref 39.0–52.0)
Hemoglobin: 14.9 g/dL (ref 13.0–17.0)
Immature Granulocytes: 1 %
Lymphocytes Relative: 68 %
Lymphs Abs: 42.1 10*3/uL — ABNORMAL HIGH (ref 0.7–4.0)
MCH: 30.2 pg (ref 26.0–34.0)
MCHC: 36 g/dL (ref 30.0–36.0)
MCV: 84 fL (ref 80.0–100.0)
Monocytes Absolute: 1.4 10*3/uL — ABNORMAL HIGH (ref 0.1–1.0)
Monocytes Relative: 2 %
Neutro Abs: 18.2 10*3/uL — ABNORMAL HIGH (ref 1.7–7.7)
Neutrophils Relative %: 29 %
Platelets: 190 10*3/uL (ref 150–400)
RBC: 4.93 MIL/uL (ref 4.22–5.81)
RDW: 12.5 % (ref 11.5–15.5)
WBC: 62.7 10*3/uL (ref 4.0–10.5)
nRBC: 0 % (ref 0.0–0.2)

## 2019-01-30 LAB — BASIC METABOLIC PANEL
Anion gap: 15 (ref 5–15)
BUN: 7 mg/dL (ref 6–20)
BUN: 7 mg/dL (ref 6–20)
CO2: 17 mmol/L — ABNORMAL LOW (ref 22–32)
CO2: 22 mmol/L (ref 22–32)
Calcium: 8.4 mg/dL — ABNORMAL LOW (ref 8.9–10.3)
Calcium: 7.8 mg/dL — ABNORMAL LOW (ref 8.9–10.3)
Chloride: <65 mmol/L — CL (ref 98–111)
Chloride: 72 mmol/L — ABNORMAL LOW (ref 98–111)
Creatinine, Ser: 0.97 mg/dL (ref 0.61–1.24)
Creatinine, Ser: 0.88 mg/dL (ref 0.61–1.24)
GFR calc Af Amer: >60 mL/min (ref >60)
GFR calc Af Amer: >60 mL/min (ref >60)
GFR calc non Af Amer: >60 mL/min (ref >60)
GFR calc non Af Amer: >60 mL/min (ref >60)
Glucose, Bld: 180 mg/dL — ABNORMAL HIGH (ref 70–99)
Glucose, Bld: 156 mg/dL — ABNORMAL HIGH (ref 70–99)
Potassium: 4.9 mmol/L (ref 3.5–5.1)
Potassium: 4.5 mmol/L (ref 3.5–5.1)
Sodium: 103 mmol/L — CL (ref 135–145)
Sodium: 109 mmol/L — CL (ref 135–145)

## 2019-01-30 LAB — COMPREHENSIVE METABOLIC PANEL
ALT: 207 U/L — ABNORMAL HIGH (ref 0–44)
AST: 234 U/L — ABNORMAL HIGH (ref 15–41)
Albumin: 4.4 g/dL (ref 3.5–5.0)
Alkaline Phosphatase: 145 U/L — ABNORMAL HIGH (ref 38–126)
BUN: 8 mg/dL (ref 6–20)
CO2: 15 mmol/L — ABNORMAL LOW (ref 22–32)
Calcium: 8.7 mg/dL — ABNORMAL LOW (ref 8.9–10.3)
Chloride: <65 mmol/L — CL (ref 98–111)
Creatinine, Ser: 1.13 mg/dL (ref 0.61–1.24)
GFR calc Af Amer: >60 mL/min (ref >60)
GFR calc non Af Amer: >60 mL/min (ref >60)
Glucose, Bld: 186 mg/dL — ABNORMAL HIGH (ref 70–99)
Potassium: 5.2 mmol/L — ABNORMAL HIGH (ref 3.5–5.1)
Sodium: 109 mmol/L — CL (ref 135–145)
Total Bilirubin: 5.8 mg/dL — ABNORMAL HIGH (ref 0.3–1.2)
Total Protein: 6.4 g/dL — ABNORMAL LOW (ref 6.5–8.1)

## 2019-01-30 LAB — PHOSPHORUS: Phosphorus: 2.5 mg/dL (ref 2.5–4.6)

## 2019-01-30 LAB — SARS CORONAVIRUS 2 BY RT PCR (HOSPITAL ORDER, PERFORMED IN ~~LOC~~ HOSPITAL LAB): SARS Coronavirus 2: NEGATIVE

## 2019-01-30 LAB — TROPONIN I: Troponin I: <0.03 ng/mL (ref <0.03)

## 2019-01-30 LAB — LIPASE, BLOOD: Lipase: 23 U/L (ref 11–51)

## 2019-01-30 LAB — AMMONIA: Ammonia: 31 umol/L (ref 9–35)

## 2019-01-30 LAB — LACTIC ACID, PLASMA: Lactic Acid, Venous: 2 mmol/L (ref 0.5–1.9)

## 2019-01-30 LAB — URIC ACID: Uric Acid, Serum: 6.1 mg/dL (ref 3.7–8.6)

## 2019-01-30 MED ORDER — SODIUM CHLORIDE 0.9 % IV BOLUS
1000.0000 mL | Freq: Once | INTRAVENOUS | Status: AC
  Administered 2019-01-30: 17:00:00 1000 mL via INTRAVENOUS

## 2019-01-30 MED ORDER — SODIUM CHLORIDE 0.9 % IV BOLUS
1000.0000 mL | Freq: Once | INTRAVENOUS | Status: AC
  Administered 2019-01-30: 1000 mL via INTRAVENOUS

## 2019-01-30 MED ORDER — SODIUM CHLORIDE 0.9 % IV SOLN
Freq: Once | INTRAVENOUS | Status: AC
  Administered 2019-01-30: 23:00:00 via INTRAVENOUS

## 2019-01-30 MED ORDER — SODIUM CHLORIDE 0.9 % IV BOLUS
1000.0000 mL | Freq: Once | INTRAVENOUS | Status: AC
  Administered 2019-01-30: 16:00:00 1000 mL via INTRAVENOUS

## 2019-01-30 MED ORDER — SODIUM CHLORIDE 0.9 % IV SOLN
1.0000 g | Freq: Once | INTRAVENOUS | Status: AC
  Administered 2019-01-30: 23:00:00 1 g via INTRAVENOUS
  Filled 2019-01-30: qty 10

## 2019-01-30 NOTE — ED Notes (Signed)
Assisted pt to call wife.

## 2019-01-30 NOTE — ED Notes (Signed)
Date and time results received: 01/30/19 9:18 PM   Test: Sodium Critical Value: 109  Name of Provider Notified: Dr.Goodman  Orders Received? Or Actions Taken?: Orders Received - See Orders for details

## 2019-01-30 NOTE — Consult Note (Signed)
[facility]  Date of admission:  01/30/2019  Inpatient day:  01/30/2019  Consulting physician: Dr Nance Pear   Reason for Consultation:  Possible leukemia  Chief Complaint: Gabriel Gray is a 58 y.o. male  with cirrhosis who presented to the emergency unable to get off of the floor.  HPI:   The patient notes a history of cirrhosis secondary to alcohol use.  He notes that he recieves his health care at [facility].  He is followed by Dr. Bonna Gains of gastroenterology at [facility] for cirrhosis.    Abdomen and pelvic CT on 09/18/2017 revealed hepatic steatosis with surface nodularity and presence of recanalized umbilical vein.  Spleen was normal in size.  There were mildly enlarged upper abdominal nodes.  He was last seen in the GI Clinic on 05/23/2018.  He was noted to have a history of large volume paracentesis (4.5 - 7 liters) between 01/03/2018 - 04/27/2018.  His health had improved with decreased MELD score had improved from 24 to 7.  He had stopped drinking in 12/2017.  He had no abdominal distention, lower extremity swelling, bleeding or confusion.  Lasix and spironolactone were reduced.  Viral hepatitis testing was negative.  Lactulose was discontinued.  He remained on rifaximin.  CBC on 06/02/2018 revealed a hematocrit of 38.7, hemoglobin 12.8, MCV 91, platelets 159,000, WBC 11,500.  No differential was available.  Sodium was 143, creatinine was 0.89, albumen 3.4, protein 4.9, bilirubin 0.6, alkaline phosphatase 116, SGOT 18 and SGPT 13.  INR was 1.1.  He states that over the past 2 weeks he has been extremely fatigued.  He describes eating very little.  He drinks water (unclear amount).  He denies any fevers, sweats or weight loss.  He does comments that last year he lost from 275 pound to 185 pounds.  He denies any adenopathy, bruising or bleeding.  He notes early satiety.  He denies any bone pain.  He states that he had diarrhea this morning.  He  fell and was unable to get up.  EMS was called.  On presentation, labs included a hematocrit of 41.4, hemoglobin 14.9, MCV 84, platelets 190,000, WBC 62,700 with an ANC of 16,200.  Differential included 29% segs, 68% lymphs, and 2% monocytes.  Smudge cells were seen.  Sodium was 109 (repeat 103), creatinine 1.13, protein 6.4, albumen 4.4, bilirubin 5.8, alkaline phosphatase 145, AST 234, ALT 207.    Ammonia was 31.  Uric acid was 6.1. Phosphorus 2.5.  SARS Coronavirus 2 negative.   Past Medical History:  Diagnosis Date  . Anxiety   . Cirrhosis ([place])   . Coronary artery disease   . Depression   . Hypertension     History reviewed. No pertinent surgical history.  Family History  Problem Relation Age of Onset  . Hypertension Mother   . Hypertension Father   . Prostate cancer Father     Social History:  reports that he has never smoked. He has never used smokeless tobacco. He reports previous alcohol use of about 2.0 - 3.0 standard drinks of alcohol per week. He reports that he does not use drugs.  He previously drank heavily for 7-8 years.  He stopped drinking in 12/2017.  Currently, he drinks "now and then".  He is alone today.  Allergies:  Allergies  Allergen Reactions  . Gabapentin Other (See Comments)    Sores, weakness, deaf in one ear    (Not in a hospital admission)   Review of Systems: GENERAL:  Fatigue.  No fevers, sweats or weight loss.  Last year weight decreased from 275 to 185 pounds. PERFORMANCE STATUS (ECOG):  2 HEENT:  No visual changes, runny nose, sore throat, mouth sores or tenderness. Lungs: No shortness of breath or cough.  No hemoptysis. Cardiac:  No chest pain, palpitations, orthopnea, or PND. GI:  Diarrhea.  No abdominal pain.  Early satiety.  No nausea, vomiting, constipation, melena or hematochezia. GU:  No urgency, frequency, dysuria, or hematuria. Musculoskeletal:  No back pain.  No joint pain.  No muscle tenderness. Extremities:  No pain or  swelling. Skin:  No rashes or skin changes. Neuro:  "Hungry headache".  No focal numbness or weakness, balance or coordination issues. Endocrine:  No diabetes, thyroid issues, hot flashes or night sweats. Psych:  No mood changes, depression or anxiety. Pain:  No focal pain. Review of systems:  All other systems reviewed and found to be negative.  Physical Exam:  Blood pressure (!) 180/122, pulse (!) 102, temperature 98.3 F (36.8 C), temperature source Oral, resp. rate (!) 22, height 6' (1.829 m), weight 260 lb (117.9 kg), SpO2 100 %.  GENERAL:  Chronically fatigued heavyset gentleman lying comfortably in the ER in no acute distress. MENTAL STATUS:  Alert and oriented to person, place [facility] ER) and time (01/2019). HEAD:  Male pattern baldness.  Gray beard.  Normocephalic, atraumatic, face symmetric, no Cushingoid features. EYES:  Brown eyes.  Pupils equal round and reactive to light and accomodation.  Scleral icterus.  No conjunctivitis. ENT:  Oropharynx clear without lesion.  Tongue normal. Mucous membranes dry.  RESPIRATORY:  Clear to auscultation anteriorly without rales, wheezes or rhonchi. CARDIOVASCULAR:  Regular rate and rhythm without murmur, rub or gallop. ABDOMEN:  Fully round.  Soft, non-tender, with active bowel sounds.  Upper abdomen full, but unable to assess hepatosplenomegaly.  No masses. SKIN:  Feet with dried stool.  No rashes, ulcers or lesions. EXTREMITIES: No edema, no skin discoloration or tenderness.  No palpable cords. LYMPH NODES: No palpable cervical, supraclavicular, axillary or inguinal adenopathy  NEUROLOGICAL: Unremarkable. PSYCH:  Appropriate.   Results for orders placed or performed during the hospital encounter of 01/30/19 (from the past 48 hour(s))  CBC with Differential     Status: Abnormal   Collection Time: 01/30/19  3:20 PM  Result Value Ref Range   WBC 62.7 (HH) 4.0 - 10.5 K/uL    Comment: This critical result has verified and been called to [facility] @1639  [id number] by Dewaine Oats on 05 18 2020 at 1651, and has been read back.  This critical result has verified and been called to [facility] @1639  [id number] AKT by Dewaine Oats on 05 18 2020 at 1700, and has been read back.     RBC 4.93 4.22 - 5.81 MIL/uL   Hemoglobin 14.9 13.0 - 17.0 g/dL   HCT 41.4 39.0 - 52.0 %   MCV 84.0 80.0 - 100.0 fL   MCH 30.2 26.0 - 34.0 pg   MCHC 36.0 30.0 - 36.0 g/dL   RDW 12.5 11.5 - 15.5 %   Platelets 190 150 - 400 K/uL   nRBC 0.0 0.0 - 0.2 %   Neutrophils Relative % 29 %   Neutro Abs 18.2 (H) 1.7 - 7.7 K/uL   Lymphocytes Relative 68 %   Lymphs Abs 42.1 (H) 0.7 - 4.0 K/uL   Monocytes Relative 2 %   Monocytes Absolute 1.4 (H) 0.1 - 1.0 K/uL   Eosinophils Relative 0 %  Eosinophils Absolute 0.0 0.0 - 0.5 K/uL   Basophils Relative 0 %   Basophils Absolute 0.3 (H) 0.0 - 0.1 K/uL   WBC Morphology SMUDGE CELLS    Immature Granulocytes 1 %   Abs Immature Granulocytes 0.68 (H) 0.00 - 0.07 K/uL   Smudge Cells PRESENT    Polychromasia PRESENT     Comment: Performed at [facility], [address] [place], [place] [address]  Comprehensive metabolic panel     Status: Abnormal   Collection Time: 01/30/19  3:20 PM  Result Value Ref Range   Sodium 109 (LL) 135 - 145 mmol/L    Comment: CRITICAL RESULT CALLED TO, READ BACK BY AND VERIFIED WITH PAIGE JOHNSON @1615  01/30/19 MJU    Potassium 5.2 (H) 3.5 - 5.1 mmol/L   Chloride <65 (LL) 98 - 111 mmol/L    Comment: CRITICAL RESULT CALLED TO, READ BACK BY AND VERIFIED WITH PAIGE JOHNSON @1615  01/30/19 MJU    CO2 15 (L) 22 - 32 mmol/L   Glucose, Bld 186 (H) 70 - 99 mg/dL   BUN 8 6 - 20 mg/dL   Creatinine, Ser 1.13 0.61 - 1.24 mg/dL   Calcium 8.7 (L) 8.9 - 10.3 mg/dL   Total Protein 6.4 (L) 6.5 - 8.1 g/dL   Albumin 4.4 3.5 - 5.0 g/dL   AST 234 (H) 15 - 41 U/L    Comment: RESULT CONFIRMED BY MANUAL DILUTION MJU   ALT 207 (H) 0 - 44 U/L    Comment: RESULT CONFIRMED BY MANUAL DILUTION MJU   Alkaline  Phosphatase 145 (H) 38 - 126 U/L   Total Bilirubin 5.8 (H) 0.3 - 1.2 mg/dL   GFR calc non Af Amer >60 >60 mL/min   GFR calc Af Amer >60 >60 mL/min   Anion gap NOT CALCULATED 5 - 15    Comment: Performed at [facility], [address] [place], [place] [address]  Lipase, blood     Status: None   Collection Time: 01/30/19  3:20 PM  Result Value Ref Range   Lipase 23 11 - 51 U/L    Comment: Performed at [facility], [address] [place], [place] [address]  Troponin I - ONCE - STAT     Status: None   Collection Time: 01/30/19  3:20 PM  Result Value Ref Range   Troponin I <0.03 <0.03 ng/mL    Comment: Performed at [facility], [address] [place], [place] [address]  Ammonia     Status: None   Collection Time: 01/30/19  3:52 PM  Result Value Ref Range   Ammonia 31 9 - 35 umol/L    Comment: Performed at [facility], [address] [place], [place] [address]  Basic metabolic panel     Status: Abnormal   Collection Time: 01/30/19  4:23 PM  Result Value Ref Range   Sodium 103 (LL) 135 - 145 mmol/L    Comment: CRITICAL RESULT CALLED TO, READ BACK BY AND VERIFIED WITH. PAIGE JOHNSON @1650  01/30/19 MJU    Potassium 4.9 3.5 - 5.1 mmol/L   Chloride <65 (LL) 98 - 111 mmol/L    Comment: CRITICAL RESULT CALLED TO, READ BACK BY AND VERIFIED WITH PAIGE JOHNSON @1650  01/30/19 MJU    CO2 17 (L) 22 - 32 mmol/L   Glucose, Bld 180 (H) 70 - 99 mg/dL   BUN 7 6 - 20 mg/dL   Creatinine, Ser 0.97 0.61 - 1.24 mg/dL   Calcium 8.4 (L)  8.9 - 10.3 mg/dL   GFR calc non Af Amer >60 >60 mL/min   GFR calc Af Amer >60 >60 mL/min   Anion gap NOT CALCULATED 5 - 15    Comment: Performed at [facility], [address] [place], [place] [address]  Uric acid     Status: None   Collection Time: 01/30/19  4:23 PM  Result Value Ref Range   Uric Acid, Serum 6.1 3.7 - 8.6 mg/dL    Comment: Performed at [facility], [address] [place],  [place] [address]  Phosphorus     Status: None   Collection Time: 01/30/19  4:23 PM  Result Value Ref Range   Phosphorus 2.5 2.5 - 4.6 mg/dL    Comment: Performed at [facility], [address] [place], [place] [address]  SARS Coronavirus 2 [facility] order, Performed in [facility] hospital lab)     Status: None   Collection Time: 01/30/19  5:32 PM  Result Value Ref Range   SARS Coronavirus 2 NEGATIVE NEGATIVE    Comment: (NOTE) If result is NEGATIVE SARS-CoV-2 target nucleic acids are NOT DETECTED. The SARS-CoV-2 RNA is generally detectable in upper and lower  respiratory specimens during the acute phase of infection. The lowest  concentration of SARS-CoV-2 viral copies this assay can detect is 250  copies / mL. A negative result does not preclude SARS-CoV-2 infection  and should not be used as the sole basis for treatment or other  patient management decisions.  A negative result may occur with  improper specimen collection / handling, submission of specimen other  than nasopharyngeal swab, presence of viral mutation(s) within the  areas targeted by this assay, and inadequate number of viral copies  (<250 copies / mL). A negative result must be combined with clinical  observations, patient history, and epidemiological information. If result is POSITIVE SARS-CoV-2 target nucleic acids are DETECTED. The SARS-CoV-2 RNA is generally detectable in upper and lower  respiratory specimens dur ing the acute phase of infection.  Positive  results are indicative of active infection with SARS-CoV-2.  Clinical  correlation with patient history and other diagnostic information is  necessary to determine patient infection status.  Positive results do  not rule out bacterial infection or co-infection with other viruses. If result is PRESUMPTIVE POSTIVE SARS-CoV-2 nucleic acids MAY BE PRESENT.   A presumptive positive result was obtained on the submitted specimen  and confirmed on repeat  testing.  While 2019 novel coronavirus  (SARS-CoV-2) nucleic acids may be present in the submitted sample  additional confirmatory testing may be necessary for epidemiological  and / or clinical management purposes  to differentiate between  SARS-CoV-2 and other Sarbecovirus currently known to infect humans.  If clinically indicated additional testing with an alternate test  methodology [phone]) is advised. The SARS-CoV-2 RNA is generally  detectable in upper and lower respiratory sp ecimens during the acute  phase of infection. The expected result is Negative. Fact Sheet for Patients:  StrictlyIdeas.no Fact Sheet for Healthcare Providers: BankingDealers.co.za This test is not yet approved or cleared by the [place] FDA and has been authorized for detection and/or diagnosis of SARS-CoV-2 by FDA under an Emergency Use Authorization (EUA).  This EUA will remain in effect (meaning this test can be used) for the duration of the COVID-19 declaration under Section 564(b)(1) of the Act, 21 U.S.C. section 360bbb-3(b)(1), unless the authorization is terminated or revoked sooner. Performed at [facility], [address],  [place], [place] [address]    No results found.   Assessment:  The patient is a 58 y.o.  gentleman with alcoholic cirrhosis who has presented with 2 weeks of fatigue and decreased oral intake.  He has new lymphocytosis since 05/2018.  WBC is 62,700.  Hematocrit and platelet count are normal  No evidence of tumor lysis syndrome.  Creatinine stable. Uric acid is normal.  Peripheral smear reveals some mature lymphocytes and additional cells with more loose cytoplasm, larger nucleus and some with nucleoli.  Some cells have the typical soccer ball appearance.  Rare cell with nuclear fold.  Interspersed throughout the lymphocytes are bare nuclei as well as smudge cells/basket cells.  Red cell morphology and platelets are  unremarkable.  Symptomatically, he is fatigued.  Exam reveals no palpable adenopathy.  Liver and spleen size are difficult to assess.  Plan:   1.   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  There are a significant number of smudge cells.  Given the rapid appearance of these cells (normal CBC in 05/2018) and several unusual cells seen on peripheral smear, anticipate flow cytometry for confirmation.  No evidence of tumor lysis syndrome.  No clinical evidence of leukostasis.  Discuss possible transfer to tertiary care hospital.   2.   Hyponatremia:  Etiology is likely secondary to underlying liver disease and possibly due to leukemia (15% incidence).  No evidence of tumor lysis.  Consider nephrology consult to assist with fluid management issues.  3.   Cirrhosis:  Prior to recent events, LFTs were normal in 05/2018.  Etiology increased LFTs unclear but can be in part related to leukemia.  Would check acute hepatitis serologies.  Follow-up with Dr Bonna Gains.     Thank you for allowing me to participate in Keeton Kassebaum 's care.  I will follow him closely with you while hospitalized and after discharge in the outpatient department.   Lequita Asal, MD  01/30/2019, 8:43 PM

## 2019-01-30 NOTE — ED Notes (Signed)
MD made aware of critical lab results

## 2019-01-30 NOTE — ED Notes (Signed)
[facility]  CALLED  PER  DR  Archie Balboa MD

## 2019-01-30 NOTE — ED Triage Notes (Addendum)
PT to ED via EMS from home. PT c/o N/D for 2 days. PT states he has been vomiting every day for a year, since he was dx with cirrhosis but has had lack of appetite for 2 weeks. PT is AO at this time. PT put on 3L by EMS for low 90's O2.

## 2019-01-30 NOTE — ED Provider Notes (Signed)
[facility] Emergency Department Provider Note  ____________________________________________   I have reviewed the triage vital signs and the nursing notes.   HISTORY  Chief Complaint Unable to get off the floor  History limited by and level 5 caveat due to: AMS HPI Gabriel Gray is a 58 y.o. male who presents to the emergency department today via EMS. He told myself that 911 was called because he could not get up off the floor. He says that he fell on the floor while trying to use the bathroom. The patient denies passing out. He denies any chest pain. Per EMS report however main complaint for nausea and diarrhea. Patient did say that he had 2 episodes of vomiting and diarrhea over the past 24 hours.   Records reviewed. Per medical record review patient has a history of cirrhosis, cad, htn.   Past Medical History:  Diagnosis Date  . Anxiety   . Cirrhosis ([place])   . Coronary artery disease   . Depression   . Hypertension     Patient Active Problem List   Diagnosis Date Noted  . Ascites 01/05/2018  . Hyponatremia 12/31/2017    History reviewed. No pertinent surgical history.  Prior to Admission medications   Medication Sig Start Date End Date Taking? Authorizing Provider  aspirin (ECOTRIN LOW STRENGTH) 81 MG EC tablet Take 1 tablet by mouth daily. 12/21/08   [provider]  buPROPion (WELLBUTRIN XL) 300 MG 24 hr tablet Take 1 tablet by mouth daily. 08/25/17   [provider]  furosemide (LASIX) 20 MG tablet TAKE 1 TABLET BY MOUTH EVERY DAY 11/18/18 12/18/18  Virgel Manifold, MD  levothyroxine (SYNTHROID, LEVOTHROID) 50 MCG tablet Take 1 tablet by mouth daily. 08/30/17 08/30/18  [provider]  ondansetron (ZOFRAN-ODT) 8 MG disintegrating tablet Take 1 tablet by mouth 3 (three) times daily as needed. 12/29/17   [provider]  spironolactone (ALDACTONE) 50 MG tablet Take 1 tablet (50 mg total) by mouth once for 1  dose. 06/15/18 06/15/18  Virgel Manifold, MD  traZODone (DESYREL) 50 MG tablet TAKE 2 TABLETS (100 MG TOTAL) BY MOUTH NIGHTLY. 04/01/18   [provider]  XIFAXAN 550 MG TABS tablet TAKE 1 TABLET (550 MG TOTAL) BY MOUTH 2 (TWO) TIMES DAILY. 11/08/18   Virgel Manifold, MD    Allergies Gabapentin  Family History  Problem Relation Age of Onset  . Hypertension Mother   . Hypertension Father   . Prostate cancer Father     Social History Social History   Tobacco Use  . Smoking status: Never Smoker  . Smokeless tobacco: Never Used  Substance Use Topics  . Alcohol use: Not Currently    Alcohol/week: 2.0 - 3.0 standard drinks    Types: 2 - 3 Glasses of wine per week    Comment: per night  . Drug use: No    Review of Systems Constitutional: No fever/chills Eyes: No visual changes. ENT: No sore throat. Cardiovascular: Denies chest pain. Respiratory: Denies shortness of breath. Gastrointestinal: Positive for nausea, vomiting and diarrhea.  Genitourinary: Negative for dysuria. Musculoskeletal: Negative for back pain. Skin: Negative for rash. Neurological: Negative for headaches, focal weakness or numbness.  ____________________________________________   PHYSICAL EXAM:  VITAL SIGNS: ED Triage Vitals  Enc Vitals Group     BP 01/30/19 1528 (!) 185/100     Pulse Rate 01/30/19 1523 (!) 108     Resp 01/30/19 1523 (!) 22     Temp 01/30/19  1523 98.3 F (36.8 C)     Temp Source 01/30/19 1523 Oral     SpO2 01/30/19 1523 100 %     Weight 01/30/19 1524 260 lb (117.9 kg)     Height 01/30/19 1524 6' (1.829 m)     Head Circumference --      Peak Flow --      Pain Score 01/30/19 1523 0   Constitutional: Awake and alert. Not completely oriented. Had to be asked questions multiple times and refocused.  Eyes: Conjunctivae are normal.  ENT      Head: Normocephalic and atraumatic.      Nose: No congestion/rhinnorhea.      Mouth/Throat: Mucous membranes are moist.       Neck: No stridor. Hematological/Lymphatic/Immunilogical: No cervical lymphadenopathy. Cardiovascular: Normal rate, regular rhythm.  No murmurs, rubs, or gallops.  Respiratory: Normal respiratory effort without tachypnea nor retractions. Breath sounds are clear and equal bilaterally. No wheezes/rales/rhonchi. Gastrointestinal: Soft and non tender. No rebound. No guarding.  Genitourinary: Deferred Musculoskeletal: Normal range of motion in all extremities. No lower extremity edema. Neurologic: Awake, alert. Not completely oriented. Poor concentration, had to be asked questions multiple times. No gross focal neurologic deficits are appreciated.  Skin:  Skin is warm, dry and intact. No rash noted.  ____________________________________________    LABS (pertinent positives/negatives)  Lactic acid 2.0 CMP na 109, k 5.2, cl <65, ast 234, alt 207, alk phos 145, t bili 5.8 CBC wbc 62.7, hgb 14.9, plt 190 Trop <0.03 Lipase 23 COVID negative Phosphorus 2.5 Uric acid 6.1 Ammonia 31 ____________________________________________   EKG  I, Nance Pear, attending physician, personally viewed and interpreted this EKG  EKG Time: 1519 Rate: 107 Rhythm: sinus tachycardia Axis: left axis deviation Intervals: qtc 438 QRS: incomplete RBBB ST changes: no st elevation Impression: abnormal ekg ____________________________________________    RADIOLOGY  CXR No acute abnormality  ____________________________________________   PROCEDURES  Procedures  CRITICAL CARE Performed by: Nance Pear   Total critical care time: 40 minutes  Critical care time was exclusive of separately billable procedures and treating other patients.  Critical care was necessary to treat or prevent imminent or life-threatening deterioration.  Critical care was time spent personally by me on the following activities: development of treatment plan with patient and/or surrogate as well as nursing,  discussions with consultants, evaluation of patient's response to treatment, examination of patient, obtaining history from patient or surrogate, ordering and performing treatments and interventions, ordering and review of laboratory studies, ordering and review of radiographic studies, pulse oximetry and re-evaluation of patient's condition.   ____________________________________________   INITIAL IMPRESSION / ASSESSMENT AND PLAN / ED COURSE  Pertinent labs & imaging results that were available during my care of the patient were reviewed by me and considered in my medical decision making (see chart for details).   Patient presented to the emergency department today because of concerns for some nausea, vomiting and weakness for the past couple of days.  He states that his main problem today was unable to get up off the floor.  Patient does have a history of cirrhosis and on initial exam was slightly altered requiring the same question be asked multiple times.  Given history of cirrhosis did have concern for possible hepatic encephalopathy.  Blood work however came back concerning for significant hyponatremia and hypochloremia.  Additionally patient had significant elevation of his white blood cell count.  I did discuss with Dr. Mike Gip with hematology oncology.  She did review the peripheral  smear.  At this point she does have a concern that it possibly could be acute leukemia.  Given this concern patient will be transferred to [facility].  In discussion with Dr. Bridgett Larsson with [facility] further work up was initiated for possible infectious cause.  Patient had a mild lactic acidosis.  Will give dose of Rocephin. ____________________________________________   FINAL CLINICAL IMPRESSION(S) / ED DIAGNOSES  Final diagnoses:  Weakness  Leukocytosis, unspecified type  Hyponatremia  Hypochloremia  Elevated liver enzymes     Note: This dictation was prepared with Dragon dictation. Any transcriptional errors that  result from this process are unintentional     Nance Pear, MD 01/30/19 2235

## 2019-01-30 NOTE — ED Notes (Signed)
PT given urinal

## 2019-01-30 NOTE — ED Notes (Signed)
[facility]  CALLED  PER  DR  Archie Balboa

## 2019-01-31 LAB — PATHOLOGIST SMEAR REVIEW

## 2019-01-31 MED ORDER — BUPROPION HCL ER (XL) 300 MG PO TB24
300.00 | ORAL_TABLET | ORAL | Status: DC
Start: 2019-02-01 — End: 2019-01-31

## 2019-01-31 MED ORDER — DEXTROSE-NACL 5-0.45 % IV SOLN
75.00 | INTRAVENOUS | Status: DC
Start: ? — End: 2019-01-31

## 2019-01-31 MED ORDER — ONDANSETRON HCL 4 MG/2ML IJ SOLN
4.00 | INTRAMUSCULAR | Status: DC
Start: ? — End: 2019-01-31

## 2019-01-31 MED ORDER — ONDANSETRON 4 MG PO TBDP
4.00 | ORAL_TABLET | ORAL | Status: DC
Start: ? — End: 2019-01-31

## 2019-01-31 MED ORDER — GENERIC EXTERNAL MEDICATION
Status: DC
Start: ? — End: 2019-01-31

## 2019-01-31 MED ORDER — HEPARIN SODIUM (PORCINE) 5000 UNIT/ML IJ SOLN
5000.00 | INTRAMUSCULAR | Status: DC
Start: 2019-01-31 — End: 2019-01-31

## 2019-01-31 MED ORDER — ASPIRIN 81 MG PO CHEW
81.00 | CHEWABLE_TABLET | ORAL | Status: DC
Start: 2019-01-31 — End: 2019-01-31

## 2019-01-31 MED ORDER — LEVOTHYROXINE SODIUM 50 MCG PO TABS
50.00 | ORAL_TABLET | ORAL | Status: DC
Start: 2019-02-01 — End: 2019-01-31

## 2019-01-31 NOTE — ED Notes (Signed)
Pt got up without assistance to bedside commode and this RN walked in on pt getting back into bed. Pt counseled about calling for help when he needs assistance. Pt in assisted back into bed. This RN will continue to monitor.

## 2019-02-04 LAB — CULTURE, BLOOD (ROUTINE X 2)
Culture: NO GROWTH
Culture: NO GROWTH
Special Requests: ADEQUATE
Special Requests: ADEQUATE

## 2019-02-14 DIAGNOSIS — C9110 Chronic lymphocytic leukemia of B-cell type not having achieved remission: Secondary | ICD-10-CM | POA: Insufficient documentation

## 2019-02-14 IMAGING — US US PARACENTESIS
1 series · 7 of 7 positions shown · non-contrast
Comparison: none

INDICATION: Cirrhosis, ascites, abdominal distension

[Series 1: us paracentesis · 7 of 7 slices shown]
[im 1/7]
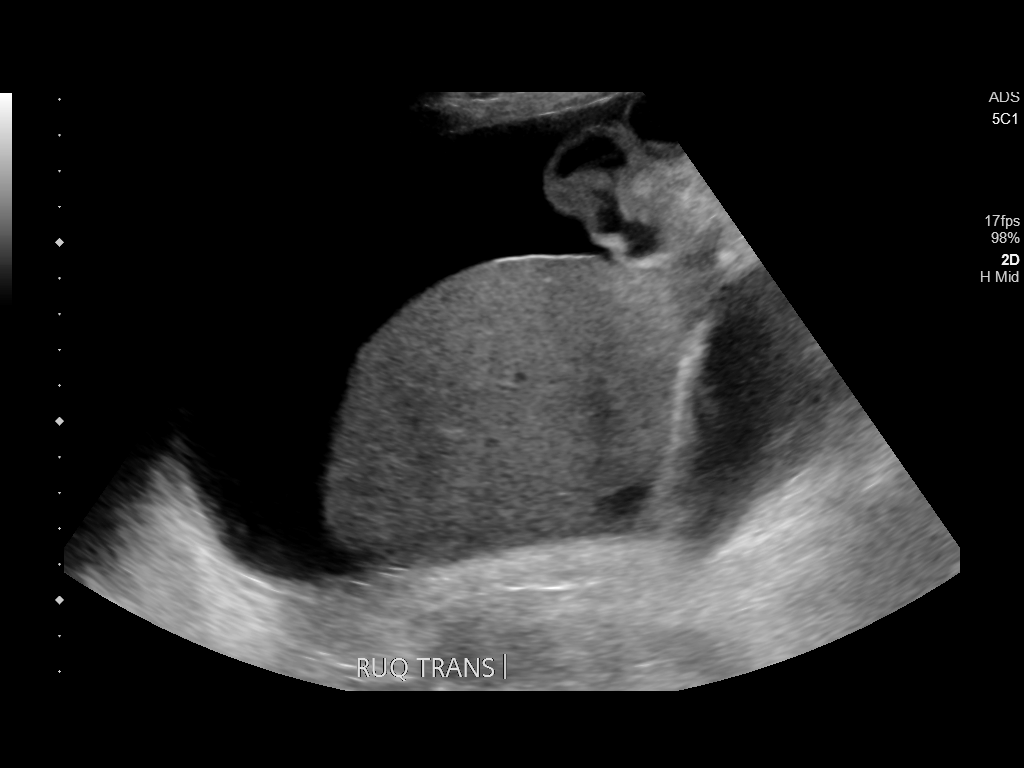
[im 2/7]
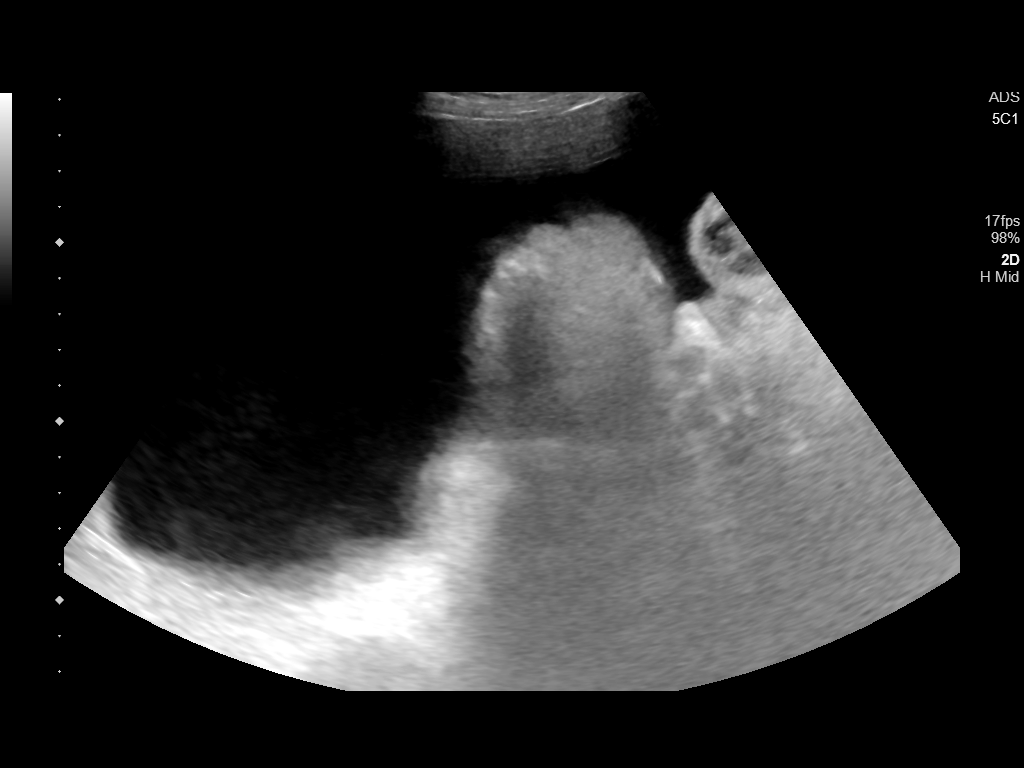
[im 3/7]
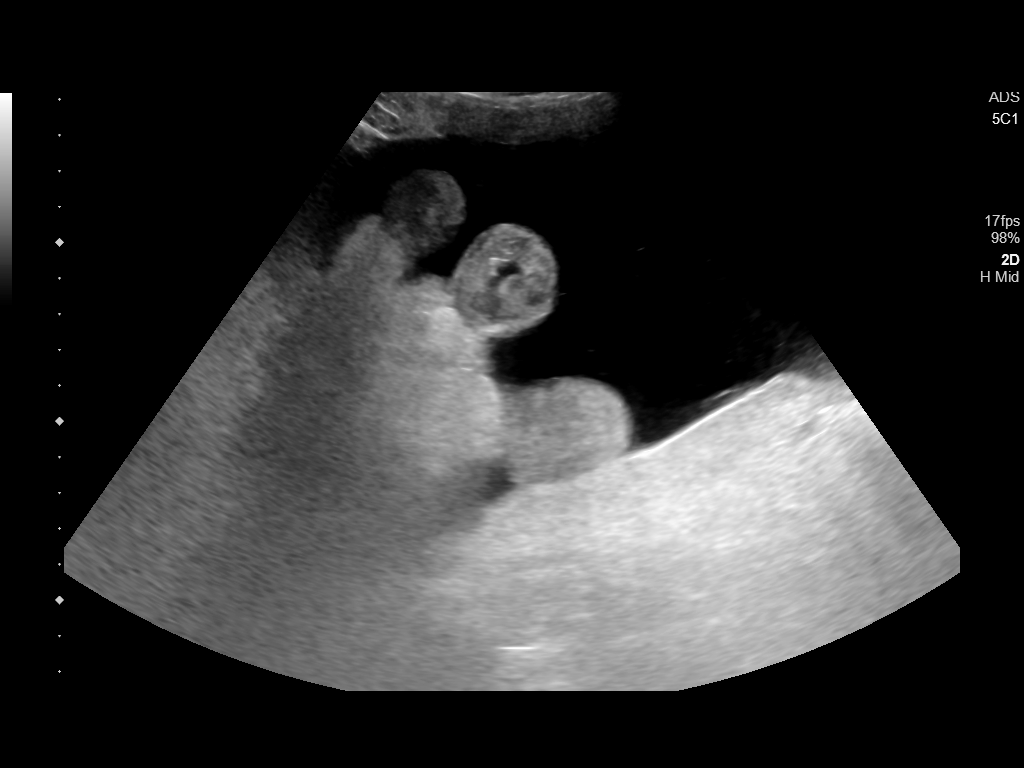
[im 4/7]
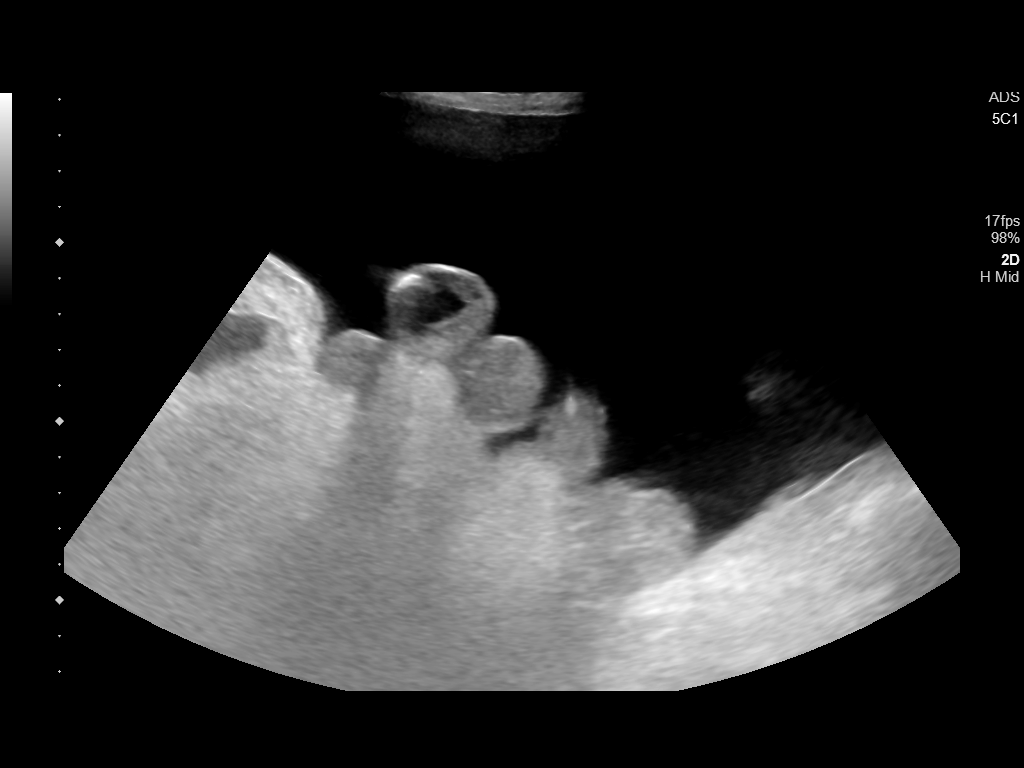
[im 5/7]
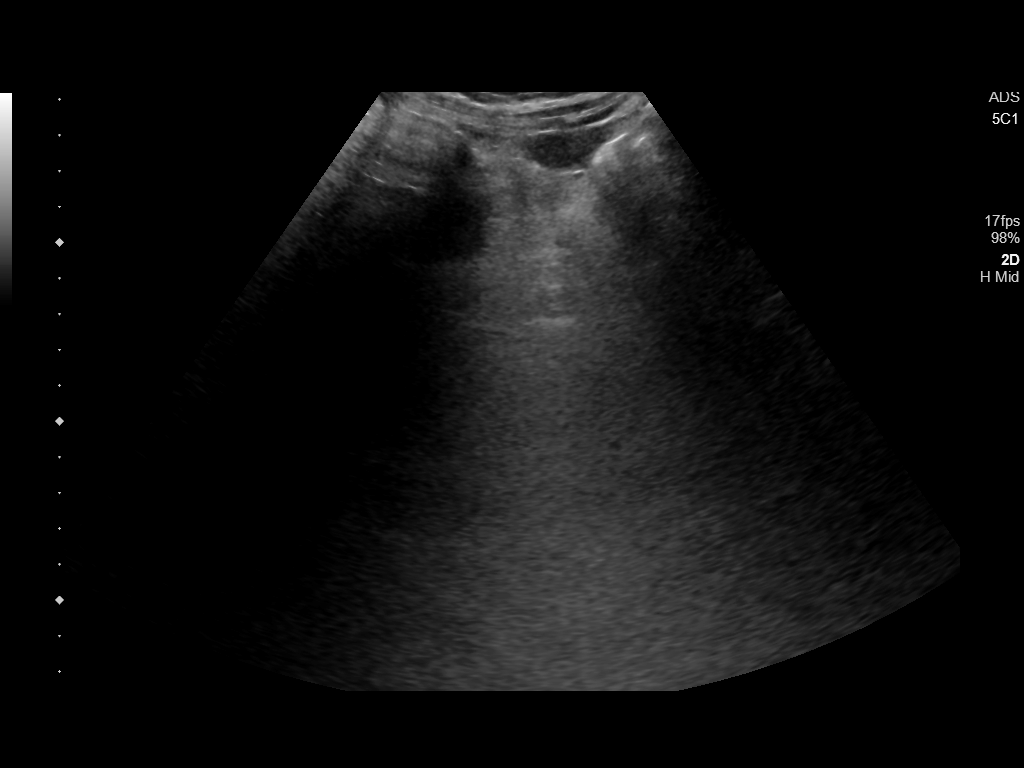
[im 6/7]
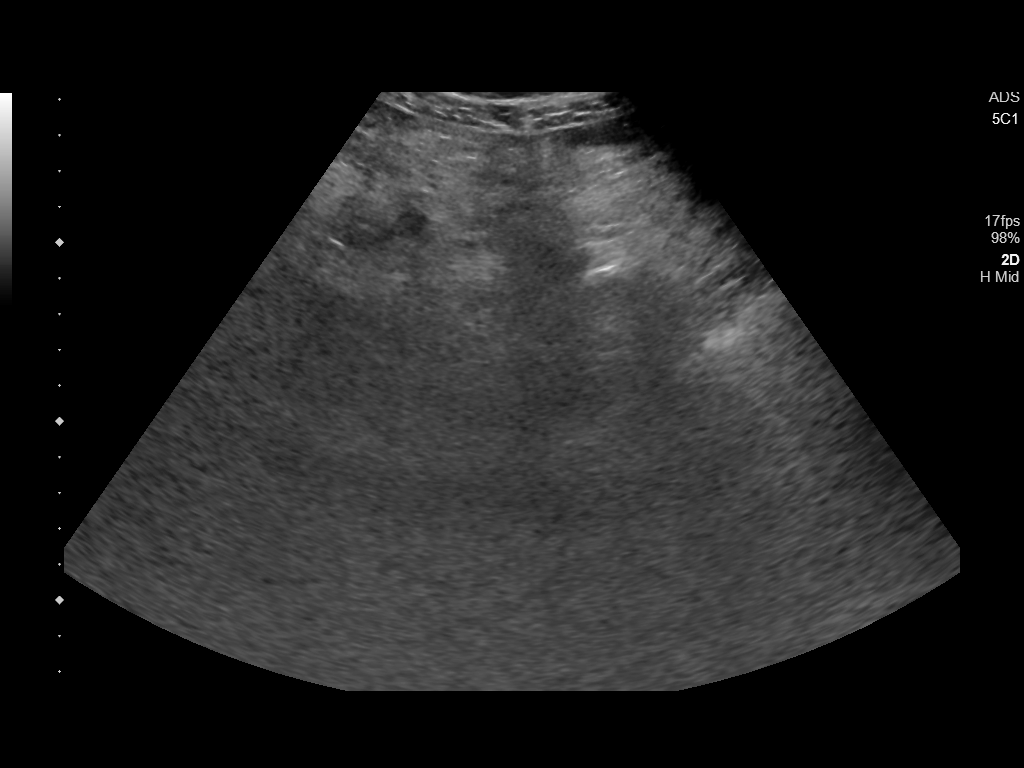
[im 7/7]
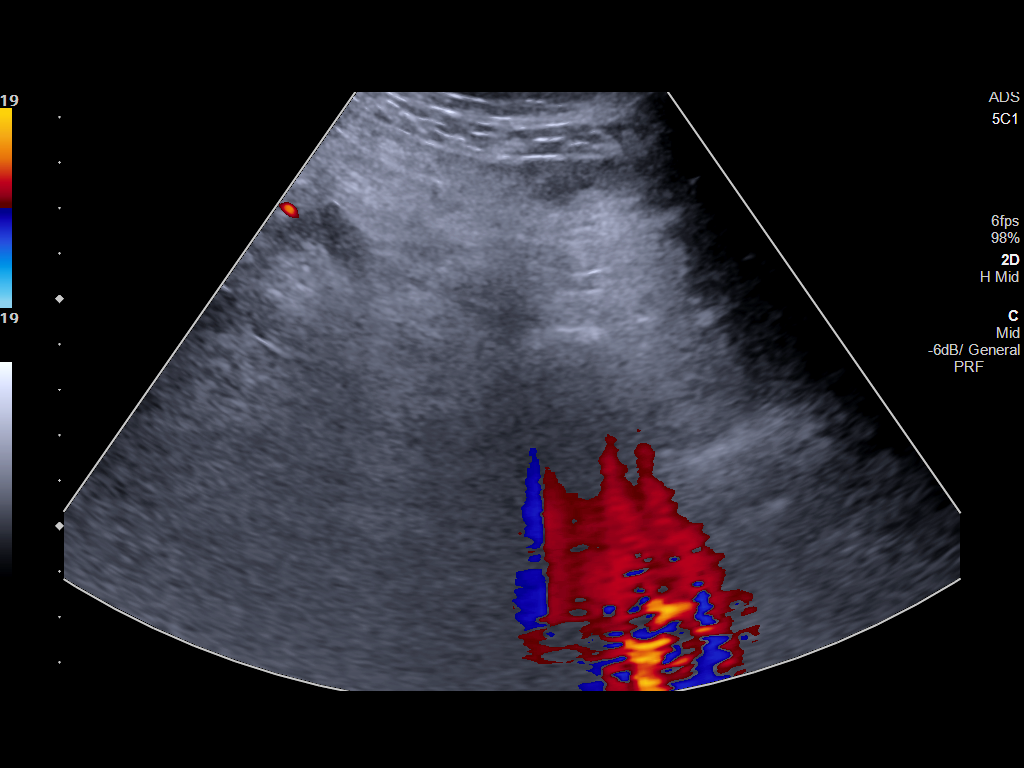

[7 of 7 positions shown; findings below may reference images not displayed]

EXAM:
ULTRASOUND GUIDED PARACENTESIS

MEDICATIONS:
1% lidocaine local

COMPLICATIONS:
None immediate.

PROCEDURE:
Informed written consent was obtained from the patient after a
discussion of the risks, benefits and alternatives to treatment. A
timeout was performed prior to the initiation of the procedure.

Initial ultrasound scanning demonstrates a large amount of ascites
within the left lower abdominal quadrant. The right lower abdomen
was prepped and draped in the usual sterile fashion. 1% lidocaine
with epinephrine was used for local anesthesia.

Following this, a 6 Fr Safe-T-Centesis catheter was introduced. An
ultrasound image was saved for documentation purposes. The
paracentesis was performed. The catheter was removed and a dressing
was applied. The patient tolerated the procedure well without
immediate post procedural complication.
FINDINGS: A total of approximately 5.7 L of clear peritoneal fluid was
removed. Sample was not sent for laboratory analysis
IMPRESSION: Successful ultrasound-guided paracentesis yielding 5.7 liters of
peritoneal fluid.

## 2019-04-10 ENCOUNTER — Ambulatory Visit (INDEPENDENT_AMBULATORY_CARE_PROVIDER_SITE_OTHER)
Admission: RE | Admit: 2019-04-10 | Discharge: 2019-04-10 | Disposition: A | Discharge status: Home / Self Care | Payer: BC Managed Care – PPO | Source: Ambulatory Visit

## 2019-04-10 DIAGNOSIS — K047 Periapical abscess without sinus: Secondary | ICD-10-CM | POA: Diagnosis not present

## 2019-04-10 DIAGNOSIS — K0889 Other specified disorders of teeth and supporting structures: Secondary | ICD-10-CM

## 2019-04-10 HISTORY — DX: Disorder of brain, unspecified: G93.9

## 2019-04-10 HISTORY — DX: Disorder of thyroid, unspecified: E07.9

## 2019-04-10 IMAGING — US US PARACENTESIS
1 series · 10 of 10 positions shown · non-contrast
Comparison: none

INDICATION: Cirrhosis and ascites.

[Series 1: us paracentesis · 10 of 10 slices shown]
[im 1/10]
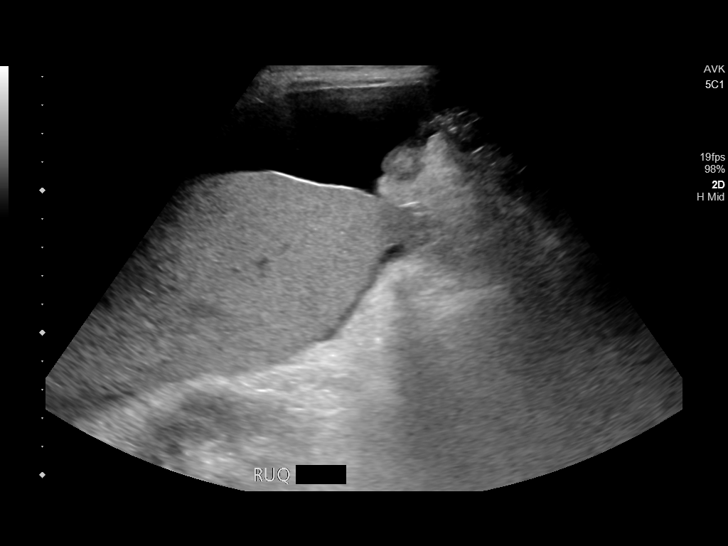
[im 2/10]
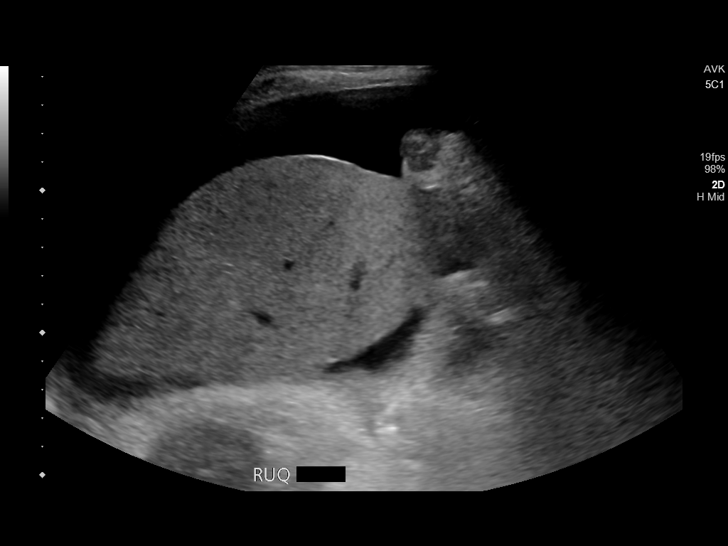
[im 3/10]
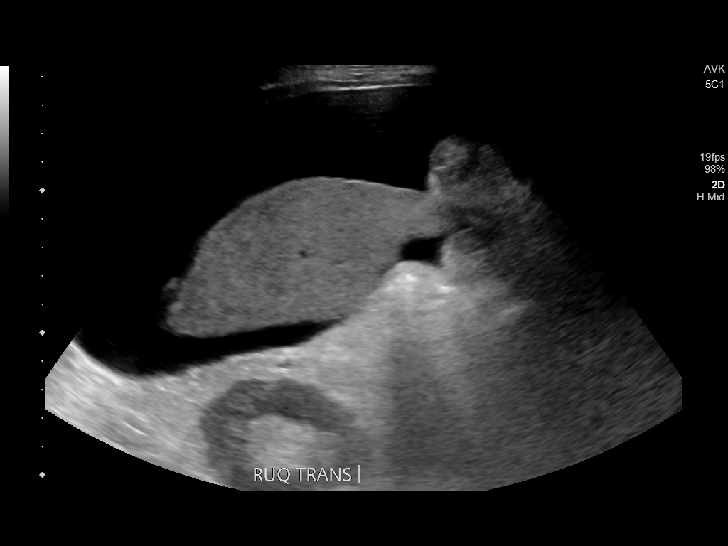
[im 4/10]
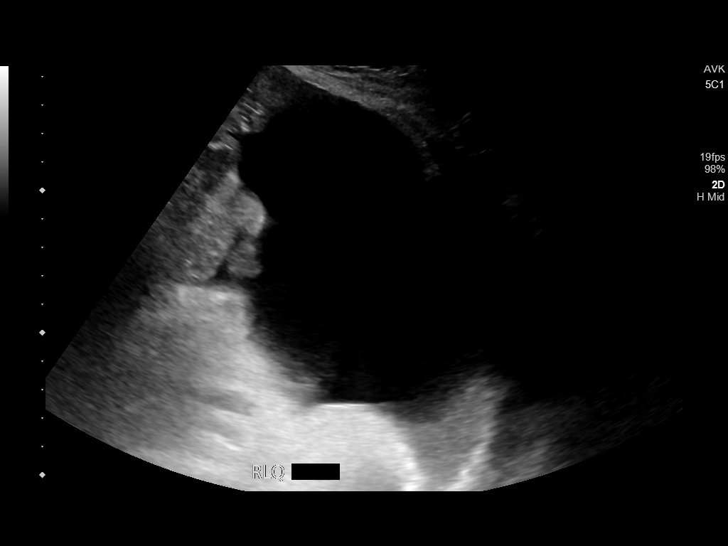
[im 5/10]
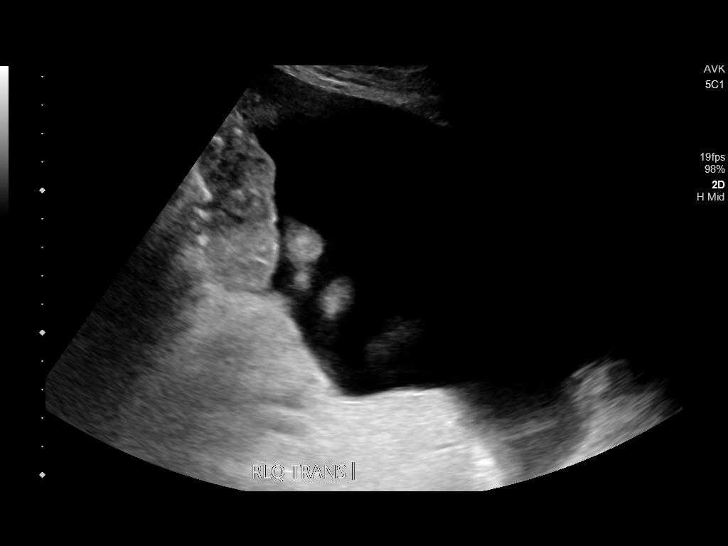
[im 6/10]
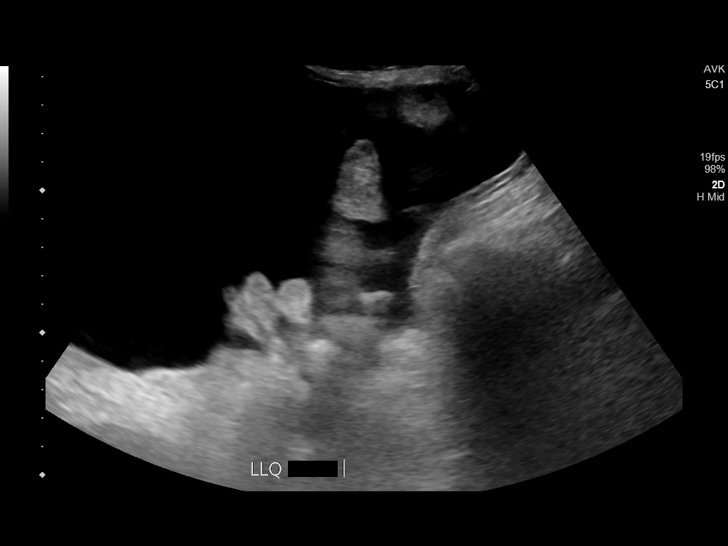
[im 7/10]
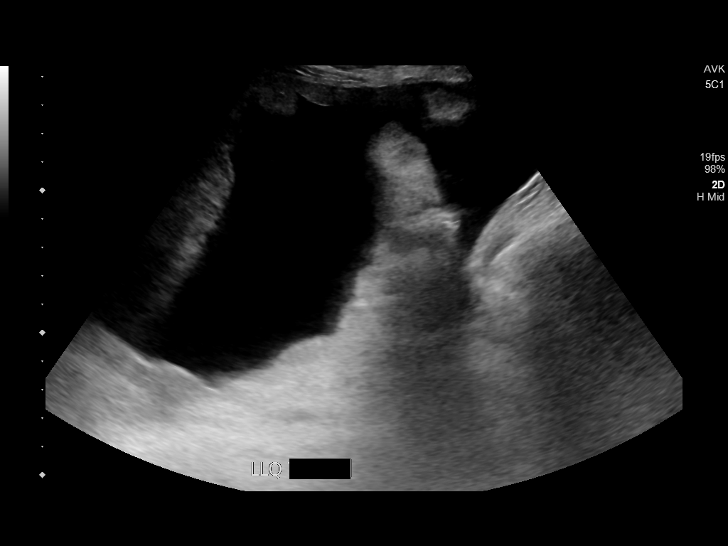
[im 8/10]
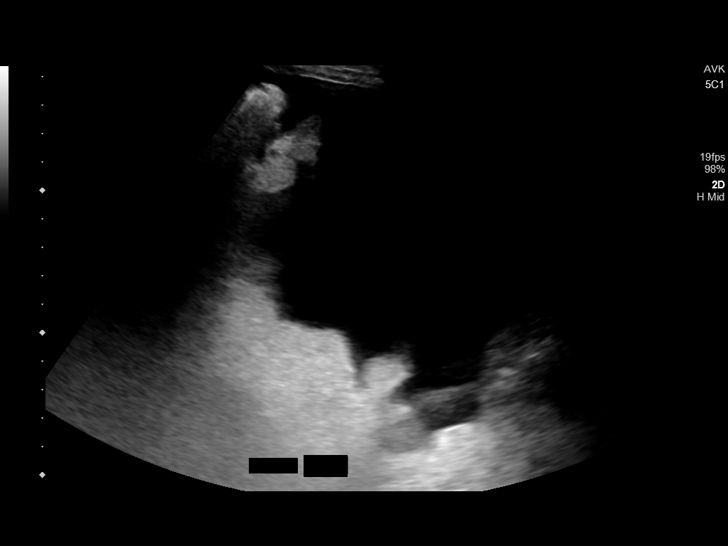
[im 9/10]
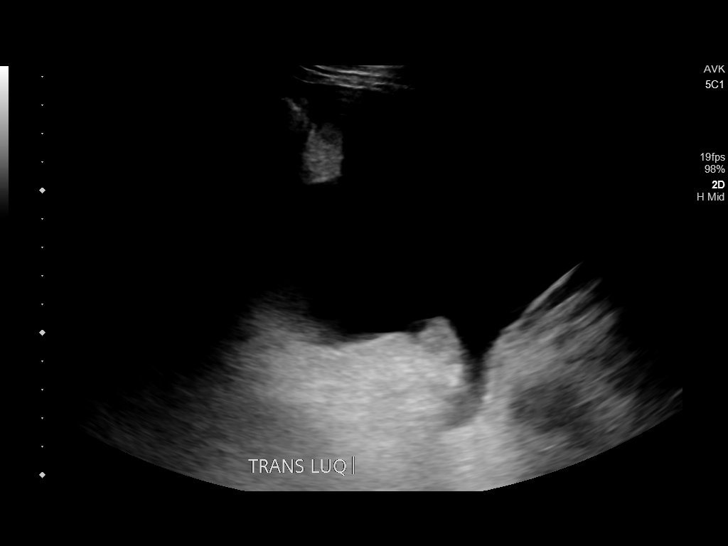
[im 10/10]
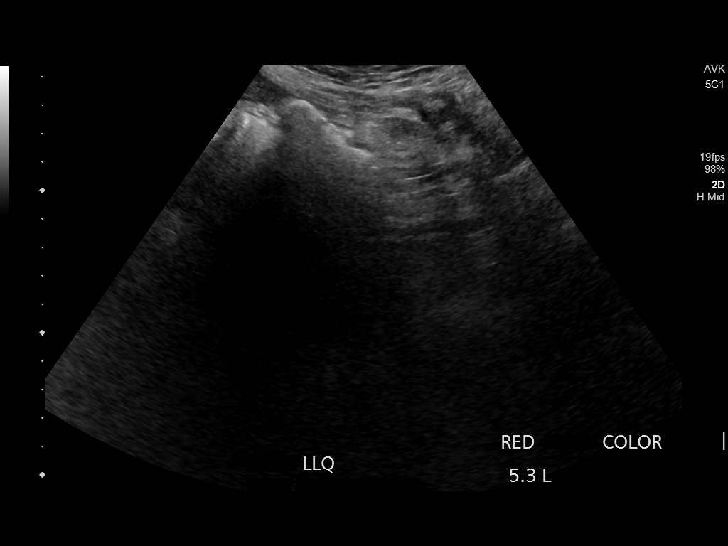

[10 of 10 positions shown; findings below may reference images not displayed]

EXAM:
ULTRASOUND GUIDED PARACENTESIS

MEDICATIONS:
None.

COMPLICATIONS:
None immediate.

PROCEDURE:
Informed written consent was obtained from the patient after a
discussion of the risks, benefits and alternatives to treatment. A
timeout was performed prior to the initiation of the procedure.

Initial ultrasound was performed to localize ascites. The left lower
abdomen was prepped and draped in the usual sterile fashion. 1%
lidocaine was used for local anesthesia.

Following this, a 6 Fr Safe-T-Centesis catheter was introduced. An
ultrasound image was saved for documentation purposes. The
paracentesis was performed. The catheter was removed and a dressing
was applied. The patient tolerated the procedure well without
immediate post procedural complication.
FINDINGS: A total of approximately 5.3 L of ascites was removed.
IMPRESSION: Successful ultrasound-guided paracentesis yielding 5.3 liters of
peritoneal fluid.

## 2019-04-10 MED ORDER — TRAMADOL HCL 50 MG PO TABS: 50.0000 mg | ORAL_TABLET | Freq: Two times a day (BID) | ORAL | 0 refills | Status: DC | PRN

## 2019-04-10 MED ORDER — NAPROXEN 375 MG PO TABS: 375.0000 mg | ORAL_TABLET | Freq: Two times a day (BID) | ORAL | 0 refills | Status: AC

## 2019-04-10 MED ORDER — CLINDAMYCIN HCL 300 MG PO CAPS: 300.0000 mg | ORAL_CAPSULE | Freq: Four times a day (QID) | ORAL | 0 refills | Status: AC

## 2019-04-10 NOTE — Discharge Instructions (Addendum)
Naproxen prescribed.  Use as directed for pain relief Clindamycin prescribed.  Take as directed and to completion Recommend soft diet until evaluated by dentist Maintain oral hygiene care Follow up with dentist as soon as possible for further evaluation and treatment  Follow up in person or go to the ED if you have any new or worsening symptoms such as fever, chills, difficulty swallowing, painful swallowing, oral or neck swelling, nausea, vomiting, chest pain, SOB, etc..Marland Kitchen

## 2019-04-10 NOTE — ED Provider Notes (Signed)
[place] Virtual Visit via Video Note:  Gabriel Gray  initiated request for Telemedicine visit with [facility] Urgent [facility] team. I connected with Octavio Manns  on 04/10/2019 at 10:49 AM  for a synchronized telemedicine visit using a video enabled HIPPA compliant telemedicine application. I verified that I am speaking with Octavio Manns  using two identifiers. Lestine Box, PA-C  was physically located in a [facility] Urgent care site and Eswin Worrell was located at a different location.   The limitations of evaluation and management by telemedicine as well as the availability of in-person appointments were discussed. Patient was informed that he  may incur a bill ( including co-pay) for this virtual visit encounter. [address] H[place]  expressed understanding and gave verbal consent to proceed with virtual visit.  [id number] 04/10/19 Arrival Time: 1021  CC: DENTAL PAIN  SUBJECTIVE:  Gabriel Gray is a 58 y.o. male who presents with dental pain x months.  Denies a precipitating event or trauma.  Localizes pain to left back molar.  Has tried OTC analgesics and tramadol without relief.  Worse with chewing, talking, and drinking.  Does not see a dentist regularly.  Reports similar symptoms in the past related to dental abscess.  Denies fever, chills, dysphagia, odynophagia, oral or neck swelling, nausea, vomiting.    ROS: As per HPI.  All other pertinent ROS negative.     Past Medical History:  Diagnosis Date  . Anxiety   . Brain disorder   . Chronic Leukemia   . Cirrhosis ([place])   . Coronary artery disease   . Depression   . Hypertension   . Thyroid disease    History reviewed. No pertinent surgical history. Allergies  Allergen Reactions  . Gabapentin Other (See Comments)    [place], weakness, deaf in one ear   No current facility-administered medications on file prior to encounter.    Current Outpatient Medications on File Prior to Encounter   Medication Sig Dispense Refill  . thiamine (VITAMIN B-1) 100 MG tablet Take 100 mg by mouth daily.    Marland Kitchen aspirin (ECOTRIN LOW STRENGTH) 81 MG EC tablet Take 1 tablet by mouth daily.    Marland Kitchen buPROPion (WELLBUTRIN XL) 300 MG 24 hr tablet Take 1 tablet by mouth daily.    Marland Kitchen levothyroxine (SYNTHROID, LEVOTHROID) 50 MCG tablet Take 1 tablet by mouth daily.    . traZODone (DESYREL) 50 MG tablet TAKE 2 TABLETS (100 MG TOTAL) BY MOUTH NIGHTLY.  5  . XIFAXAN 550 MG TABS tablet TAKE 1 TABLET (550 MG TOTAL) BY MOUTH 2 (TWO) TIMES DAILY. 60 tablet 1    OBJECTIVE:  There were no vitals filed for this visit.  General appearance: alert; no distress Eyes: EOMI grossly HENT: normocephalic; atraumatic Neck: supple with FROM Lungs: normal respiratory effort; speaking in full sentences without difficulty Extremities: moves extremities without difficulty Skin: No obvious rashes Neurologic: No facial asymmetries Psychological: alert and cooperative; normal mood and affect  ASSESSMENT & PLAN:  1. Dental infection   2. Pain, dental     Meds ordered this encounter  Medications  . naproxen (NAPROSYN) 375 MG tablet    Sig: Take 1 tablet (375 mg total) by mouth 2 (two) times daily.    Dispense:  20 tablet    Refill:  0    Order Specific Question:   Supervising Provider    Answer:   Raylene Everts [[id number]]  . clindamycin (CLEOCIN) 300 MG capsule  Sig: Take 1 capsule (300 mg total) by mouth every 6 (six) hours for 10 days.    Dispense:  40 capsule    Refill:  0    Order Specific Question:   Supervising Provider    Answer:   Raylene Everts [[id number]]    Naproxen prescribed.  Use as directed for pain relief Clindamycin prescribed.  Take as directed and to completion Recommend soft diet until evaluated by dentist Maintain oral hygiene care Follow up with dentist as soon as possible for further evaluation and treatment  Follow up in person or go to the ED if you have any new or worsening  symptoms such as fever, chills, difficulty swallowing, painful swallowing, oral or neck swelling, nausea, vomiting, chest pain, SOB, etc...  I discussed the assessment and treatment plan with the patient. The patient was provided an opportunity to ask questions and all were answered. The patient agreed with the plan and demonstrated an understanding of the instructions.   The patient was advised to call back or seek an in-person evaluation if the symptoms worsen or if the condition fails to improve as anticipated.  I provided 20 minutes of non-face-to-face time during this encounter.  Lestine Box, PA-C  04/10/2019 10:49 AM    Lestine Box, PA-C 04/10/19 1051

## 2019-04-18 ENCOUNTER — Ambulatory Visit (INDEPENDENT_AMBULATORY_CARE_PROVIDER_SITE_OTHER)
Admission: RE | Admit: 2019-04-18 | Discharge: 2019-04-18 | Disposition: A | Discharge status: Home / Self Care | Payer: BC Managed Care – PPO | Source: Ambulatory Visit

## 2019-04-18 DIAGNOSIS — K0889 Other specified disorders of teeth and supporting structures: Secondary | ICD-10-CM | POA: Diagnosis not present

## 2019-04-18 MED ORDER — TRAMADOL HCL 50 MG PO TABS: 50.0000 mg | ORAL_TABLET | Freq: Four times a day (QID) | ORAL | 0 refills | Status: DC | PRN

## 2019-04-18 NOTE — Discharge Instructions (Signed)
Tramadol for severe pain pain, use sparingly, do not drive or work after taking, may cause drowsiness  Use anti-inflammatories for pain/swelling. You may take up to 800 mg Ibuprofen every 8 hours with food. You may supplement Ibuprofen with Tylenol [phone] mg every 8 hours.   Follow up with Dentistry

## 2019-04-18 NOTE — ED Provider Notes (Addendum)
Virtual Visit via Video Note:  Gabriel Gray  initiated request for Telemedicine visit with [facility] Urgent Care team. I connected with Gabriel Gray  on 04/18/2019 at 9:32 AM  for a synchronized telemedicine visit using a video enabled HIPPA compliant telemedicine application. I verified that I am speaking with Gabriel Gray  using two identifiers. Hallie C Wieters, PA-C  was physically located in a [facility] Urgent care site and Neymar Dowe was located at a different location.   The limitations of evaluation and management by telemedicine as well as the availability of in-person appointments were discussed. Patient was informed that he  may incur a bill ( including co-pay) for this virtual visit encounter. [address] [place]  expressed understanding and gave verbal consent to proceed with virtual visit.     History of Present Illness:Gabriel Gray  is a 58 y.o. male presents for evaluation of dental pain.  Patient has had dental pain over the past 1 to 2 weeks.  He had previous virtual visit with [place] on 7/27 and was given clindamycin and tramadol.  At the time he had concerns for infection and an abscess to his left upper jaw.  Since the swelling and pain has improved, but pain has persisted.  Pain is increased over recently, but denies any swelling.  He plans to follow-up with dentistry next week, and is working on trying to move his appointment up.  He denies any fevers.  He has been taking Naprosyn without relief.  At times he will take 600 mg ibuprofen and 1500 mg Tylenol.  He states that these have not been helping his pain.  He is requesting refill of tramadol.   No Known Allergies   Past Medical History:  Diagnosis Date  . Anxiety   . Brain disorder   . Chronic Leukemia   . Cirrhosis ([place])   . Coronary artery disease   . Depression   . Hypertension   . Thyroid disease      Social History   Tobacco Use  . Smoking status: Never Smoker  . Smokeless  tobacco: Never Used  Substance Use Topics  . Alcohol use: Not Currently    Alcohol/week: 2.0 - 3.0 standard drinks    Types: 2 - 3 Glasses of wine per week    Comment: per night  . Drug use: No       Observations/Objective: Physical Exam  Constitutional: He is oriented to person, place, and time and well-developed, well-nourished, and in no distress. No distress.  No acute distress  HENT:  Head: Normocephalic and atraumatic.  Speaking without difficulty  Eyes:  Wearing glasses  Neck:  No neck swelling or erythema noted  Pulmonary/Chest:  Breathing comfortably at rest, speaking in full sentences  Neurological: He is alert and oriented to person, place, and time.  Speech clear, face symmetric  Nursing note and vitals reviewed.    Assessment and Plan:    ICD-10-CM   1. Pain, dental  K08.89      Patient with continued dental pain, infection seems to have resolved with clindamycin.  Will provide a few tablets of tramadol told patient over until he can follow-up with dentistry to seek more permanent relief of dental pain.  No obvious facial or neck swelling seen over video.  Speaking clearly and breathing normally.  Instructed to use Tylenol and ibuprofen for mild to moderate pain.  Use tramadol sparingly.  Registry was checked for controlled substances, previous prescription 7/27  and was provided with 6 tablets.  Feel risk is low with patient and benefit outweighs risks.  Follow Up Instructions:     I discussed the assessment and treatment plan with the patient. The patient was provided an opportunity to ask questions and all were answered. The patient agreed with the plan and demonstrated an understanding of the instructions.   The patient was advised to call back or seek an in-person evaluation if the symptoms worsen or if the condition fails to improve as anticipated.      Janith Lima, PA-C  04/18/2019 9:32 AM         Janith Lima, PA-C 04/18/19  0941    Janith Lima, PA-C 04/18/19 [phone]

## 2019-05-15 IMAGING — US US ABDOMEN LIMITED
1 series · 7 of 7 positions shown · non-contrast
Comparison: 04/27/2018

CLINICAL DATA: Cirrhosis and ascites. Most recent paracentesis
procedure on 04/27/2018. Recent increase in diuretic dosing.

EXAM:
LIMITED ABDOMEN ULTRASOUND FOR ASCITES
TECHNIQUE: Limited ultrasound survey for ascites was performed in all four
abdominal quadrants.

[Series 1: us abdomen limited · 7 of 7 slices shown]
[im 1/7]
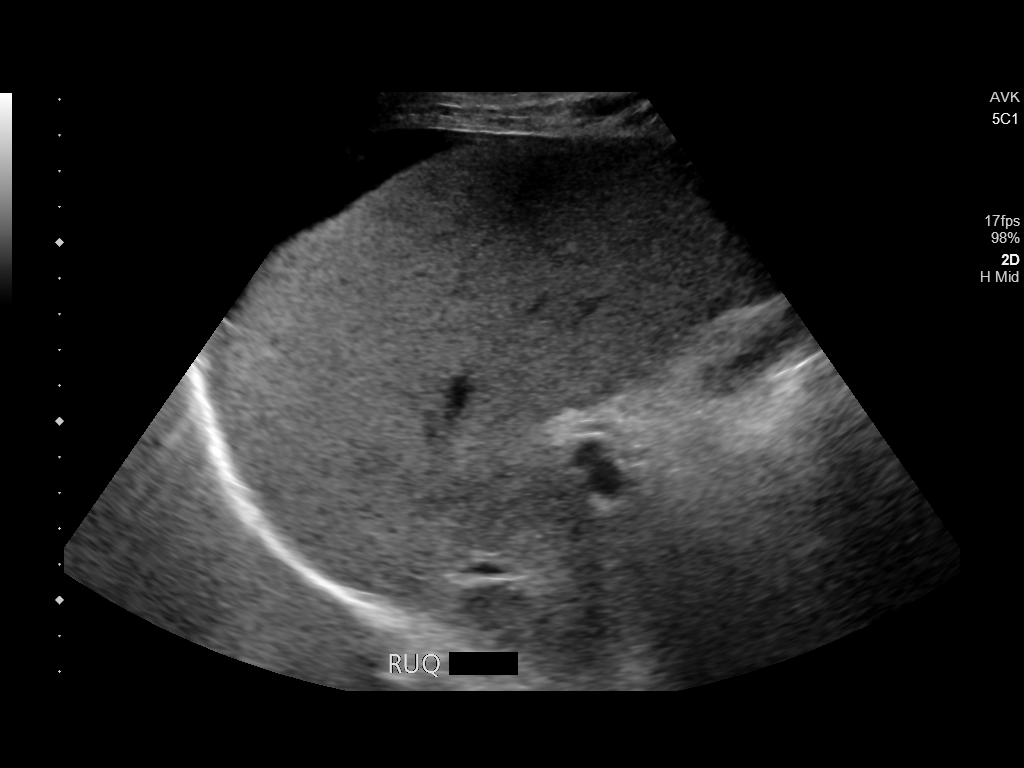
[im 2/7]
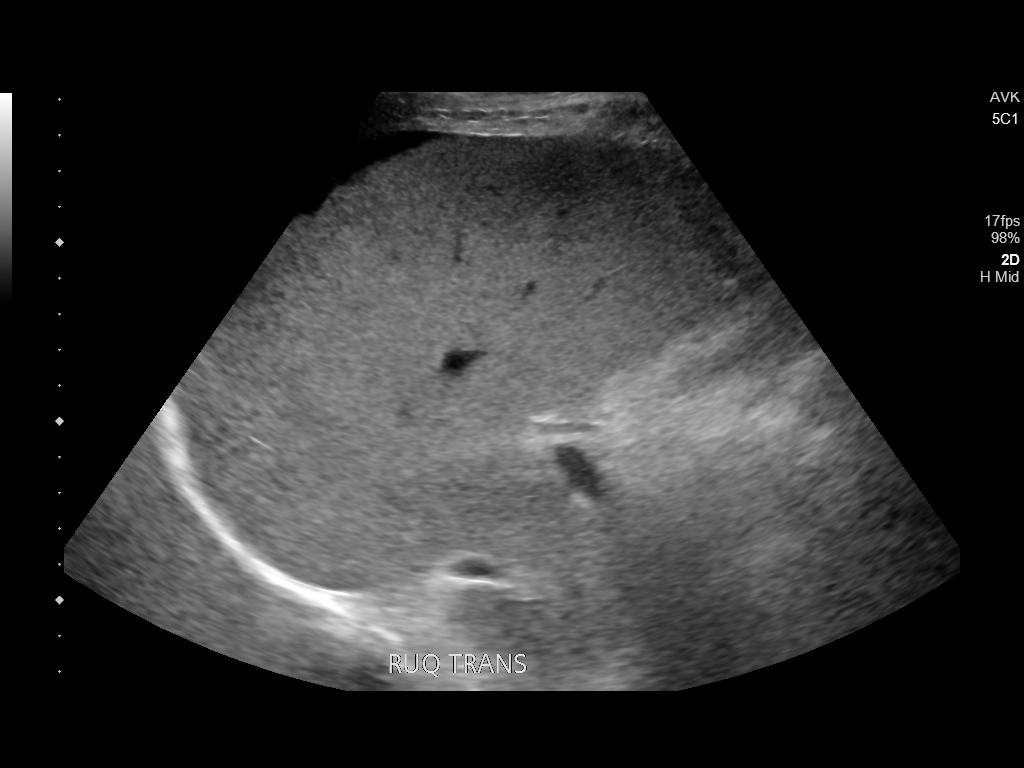
[im 3/7]
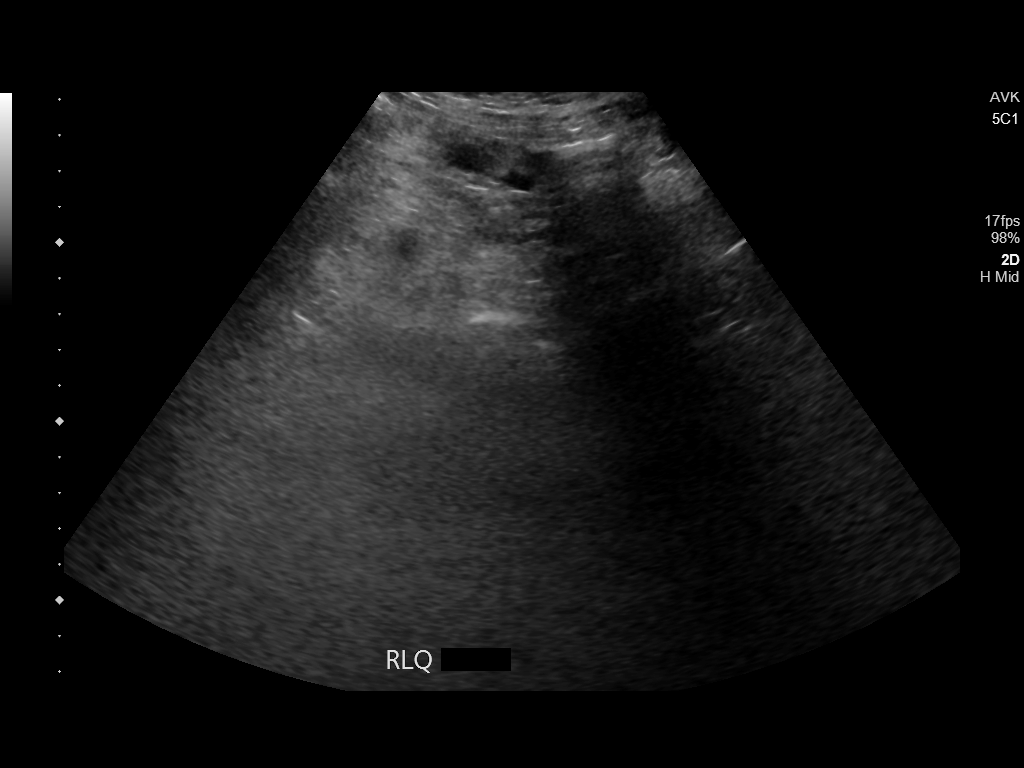
[im 4/7]
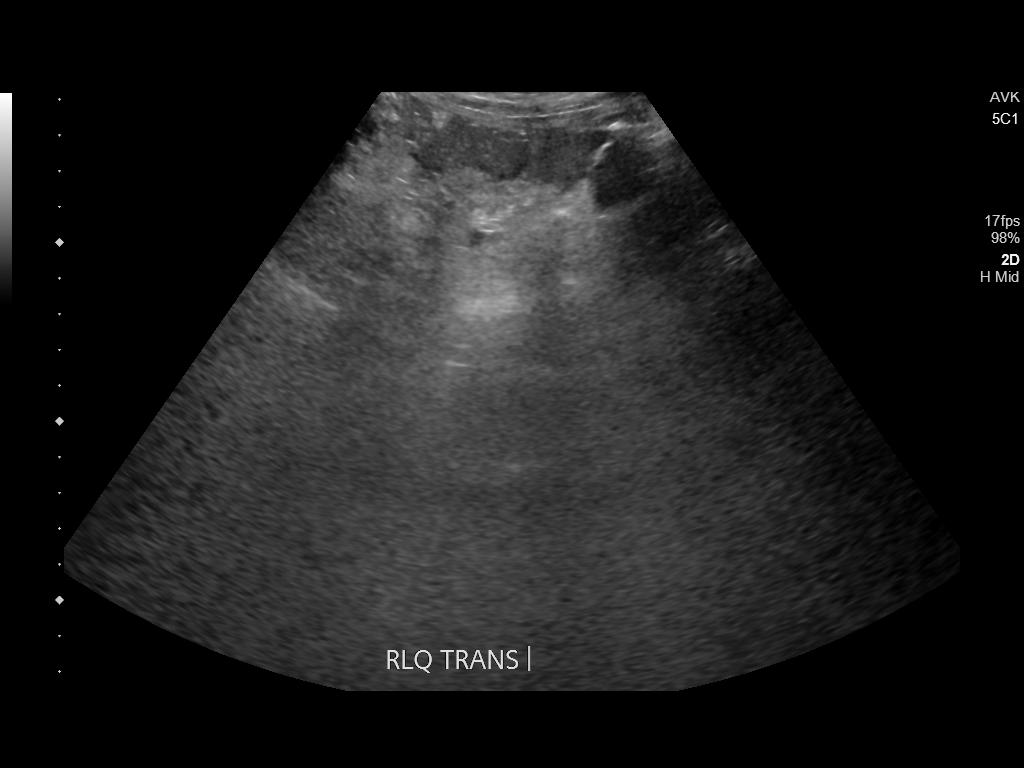
[im 5/7]
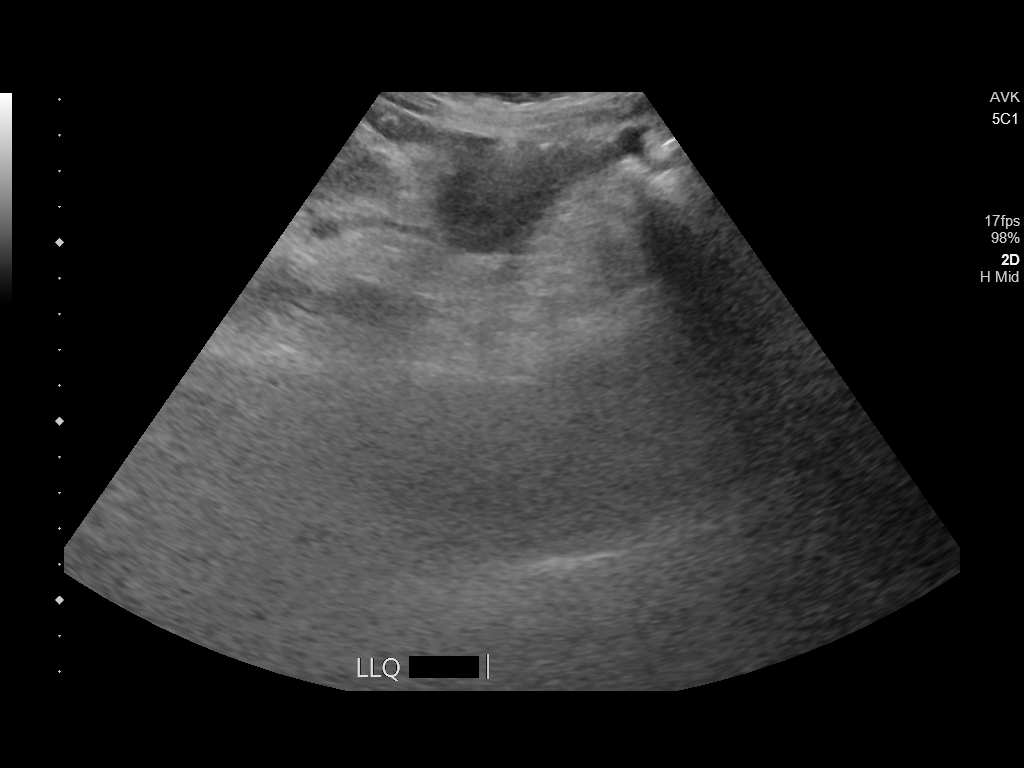
[im 6/7]
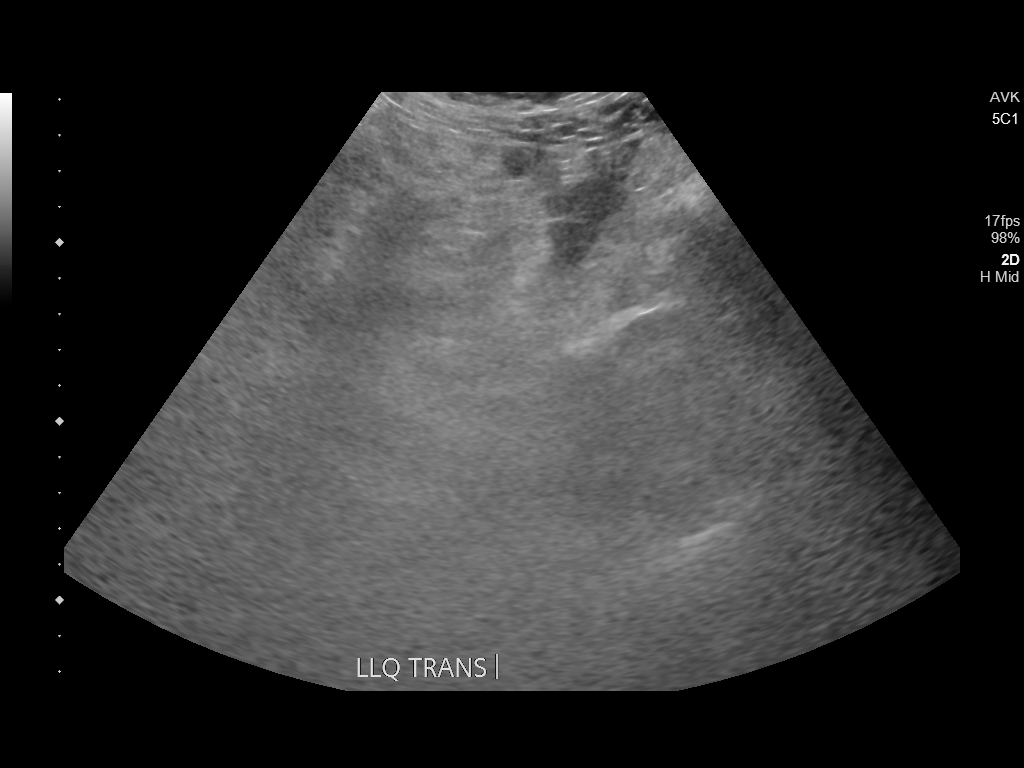
[im 7/7]
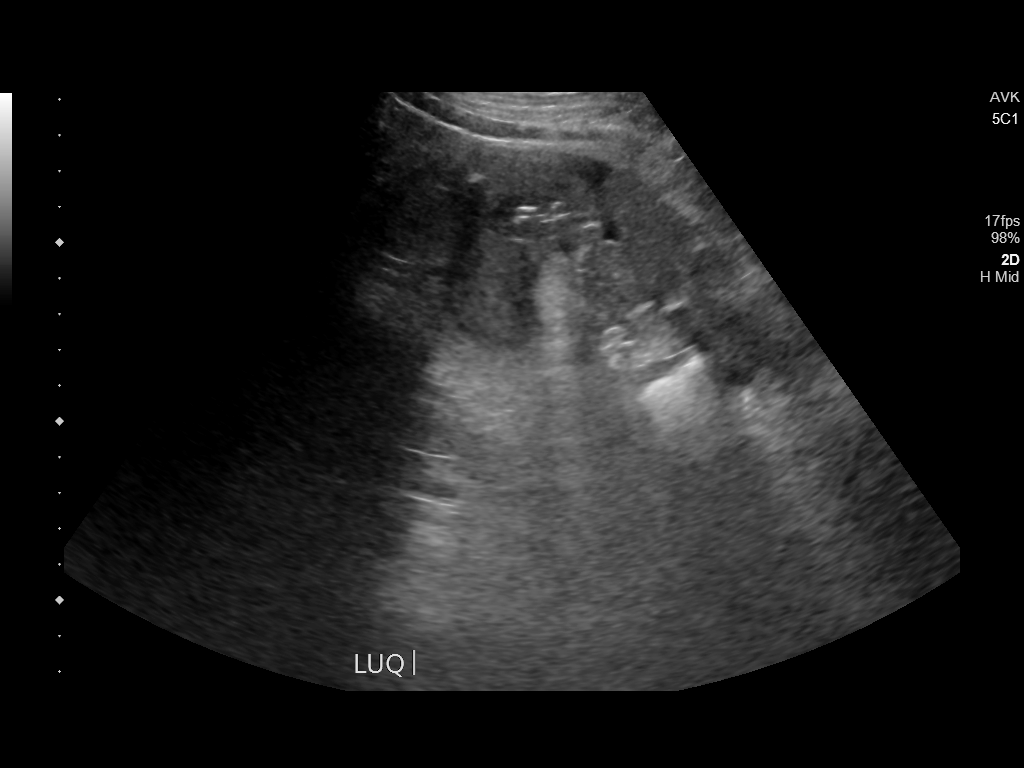

[7 of 7 positions shown; findings below may reference images not displayed]

FINDINGS: Ultrasound today essentially shows no ascites in the peritoneal
cavity. There is a trace amount of fluid around the liver and a tiny
amount of fluid noted in between some small bowel loops.
IMPRESSION: Substantial decrease in accumulation of ascites since the most
recent paracentesis procedure with only a trace amount of fluid seen
around the liver and in between small bowel loops. Paracentesis was
not necessary today.

## 2019-05-23 ENCOUNTER — Ambulatory Visit (INDEPENDENT_AMBULATORY_CARE_PROVIDER_SITE_OTHER)
Admission: RE | Admit: 2019-05-23 | Discharge: 2019-05-23 | Disposition: A | Discharge status: Home / Self Care | Payer: BC Managed Care – PPO | Source: Ambulatory Visit

## 2019-05-23 DIAGNOSIS — K047 Periapical abscess without sinus: Secondary | ICD-10-CM

## 2019-05-23 DIAGNOSIS — K089 Disorder of teeth and supporting structures, unspecified: Secondary | ICD-10-CM | POA: Diagnosis not present

## 2019-05-23 DIAGNOSIS — G8929 Other chronic pain: Secondary | ICD-10-CM

## 2019-05-23 MED ORDER — TRAMADOL HCL 50 MG PO TABS: 50.0000 mg | ORAL_TABLET | Freq: Two times a day (BID) | ORAL | 0 refills | Status: AC | PRN

## 2019-05-23 MED ORDER — CLINDAMYCIN HCL 300 MG PO CAPS: 300.0000 mg | ORAL_CAPSULE | Freq: Three times a day (TID) | ORAL | 0 refills | Status: AC

## 2019-05-23 NOTE — ED Provider Notes (Addendum)
[place] Virtual Visit via Video Note:  Jaqui Oftedahl  initiated request for Telemedicine visit with [facility] Urgent Care team. I connected with Octavio Manns  on 05/23/2019 at 4:58 PM  for a synchronized telemedicine visit using a telephone enabled HIPPA compliant telemedicine application. I verified that I am speaking with Octavio Manns  using two identifiers. Lestine Box, PA-C  was physically located in a [facility] Urgent care site and Rahmir Sh[place] was located at a different location.   The limitations of evaluation and management by telemedicine as well as the availability of in-person appointments were discussed. Patient was informed that he  may incur a bill ( including co-pay) for this virtual visit encounter. [address] [place]  expressed understanding and gave verbal consent to proceed with virtual visit.  [id number] 05/23/19 Arrival Time: 1638  CC: DENTAL PAIN  SUBJECTIVE:  Gabriel Gray is a 58 y.o. male who presents with acute on chronic dental pain.  Recent flare up over the past few days.   Denies a precipitating event or trauma.  Localizes pain to LT second to back molar on bottom side.  Also mentions new upper left sided dental pain.  Has tried OTC analgesics without relief.  Worse with chewing.  Has dentist appt on 06/09/2019.  Reports similar symptoms in the past that improved with antibiotic and tramadol.  Denies fever, chills, dysphagia, odynophagia, oral or neck swelling, nausea, vomiting, chest pain, SOB.    ROS: As per HPI.  All other pertinent ROS negative.     Past Medical History:  Diagnosis Date  . Anxiety   . Brain disorder   . Chronic Leukemia   . Cirrhosis ([place])   . Coronary artery disease   . Depression   . Hypertension   . Thyroid disease    History reviewed. No pertinent surgical history. No Known Allergies No current facility-administered medications on file prior to encounter.    Current Outpatient  Medications on File Prior to Encounter  Medication Sig Dispense Refill  . aspirin (ECOTRIN LOW STRENGTH) 81 MG EC tablet Take 1 tablet by mouth daily.    Marland Kitchen buPROPion (WELLBUTRIN XL) 300 MG 24 hr tablet Take 1 tablet by mouth daily.    Marland Kitchen levothyroxine (SYNTHROID, LEVOTHROID) 50 MCG tablet Take 1 tablet by mouth daily.    . naproxen (NAPROSYN) 375 MG tablet Take 1 tablet (375 mg total) by mouth 2 (two) times daily. 20 tablet 0  . thiamine (VITAMIN B-1) 100 MG tablet Take 100 mg by mouth daily.    . traZODone (DESYREL) 50 MG tablet TAKE 2 TABLETS (100 MG TOTAL) BY MOUTH NIGHTLY.  5  . XIFAXAN 550 MG TABS tablet TAKE 1 TABLET (550 MG TOTAL) BY MOUTH 2 (TWO) TIMES DAILY. 60 tablet 1    OBJECTIVE:  There were no vitals filed for this visit.  General: alert; no distress HENT: no hot potato voice Lungs: normal respiratory effort; speaking in full sentences without difficulty Neurologic: No slurred speech Psychological: alert and cooperative; normal mood and affect  ASSESSMENT & PLAN:  1. Chronic dental pain   2. Dental infection     Meds ordered this encounter  Medications  . traMADol (ULTRAM) 50 MG tablet    Sig: Take 1 tablet (50 mg total) by mouth every 12 (twelve) hours as needed for severe pain.    Dispense:  10 tablet    Refill:  0    Order Specific Question:   Supervising  Provider    Answer:   Raylene Everts [id number]  . clindamycin (CLEOCIN) 300 MG capsule    Sig: Take 1 capsule (300 mg total) by mouth 3 (three) times daily for 10 days.    Dispense:  30 capsule    Refill:  0    Order Specific Question:   Supervising Provider    Answer:   Raylene Everts [id number]    Rest and push fluids Clindamycin prescribed for possible dental infection Use OTC ibuprofen and/or tylenol as needed for pain Tramadol for severe break-through pain.  DO NOT TAKE prior to driving or operating heavy machinery as this medication may make you drowsy.  DO NOT TAKE with trazodone as this may  cause serotonin syndrome or depress your central nervous system Recommend a soft diet  Maintain proper dental hygiene Follow up with dentist at scheduled appt on 06/09/2019 Follow up in person or go to the ED if you have any new or worsening symptoms such as fever, chills, nausea, vomiting, worsening pain despite medications, etc...   I discussed the assessment and treatment plan with the patient. The patient was provided an opportunity to ask questions and all were answered. The patient agreed with the plan and demonstrated an understanding of the instructions.   The patient was advised to call back or seek an in-person evaluation if the symptoms worsen or if the condition fails to improve as anticipated.  I provided 15 minutes of non-face-to-face time during this encounter.    Lestine Box, PA-C 05/23/19 1700

## 2019-05-23 NOTE — Discharge Instructions (Addendum)
Rest and push fluids Clindamycin prescribed for possible dental infection Use OTC ibuprofen and/or tylenol as needed for pain Tramadol for severe break-through pain.  DO NOT TAKE prior to driving or operating heavy machinery as this medication may make you drowsy.  DO NOT TAKE with trazodone as this may cause serotonin syndrome or depress your central nervous system Recommend a soft diet  Maintain proper dental hygiene Follow up with dentist at scheduled appt on 06/09/2019 Follow up in person or go to the ED if you have any new or worsening symptoms such as fever, chills, nausea, vomiting, worsening pain despite medications, etc..Marland Kitchen

## 2019-06-26 IMAGING — MR MR HEAD W/O CM
11 series · 40 of 48 positions shown · non-contrast
Comparison: Head CT without contrast 01/25/2018

CLINICAL DATA: 57-year-old male with dizziness, unsteady gait,
lower extremity weakness, and falls since July 2017.

EXAM:
MRI HEAD WITHOUT CONTRAST
TECHNIQUE: Multiplanar, multiecho pulse sequences of the brain and surrounding
structures were obtained without intravenous contrast.

[Series 5: T1 · sagittal · 5.0mm · 0.62mm/px · 3 of 25 slices shown (1 of 2)]
[im 1/25]
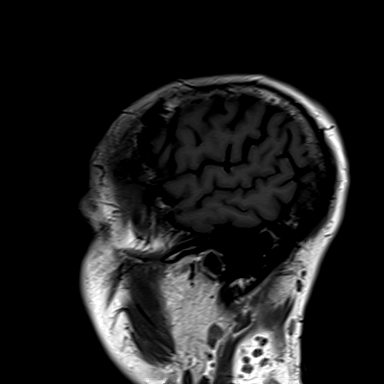
[im 13/25]
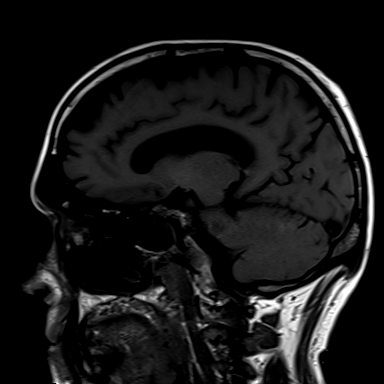
[im 25/25]
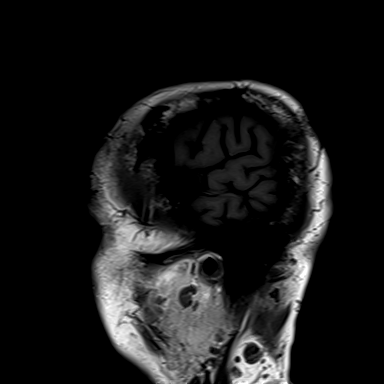

[Series 6: ax dwi_tracew · axial · 3.0mm · 0.60mm/px · z∈[-57,+103]mm · 5 of 55 slices shown]
[im 1/55]
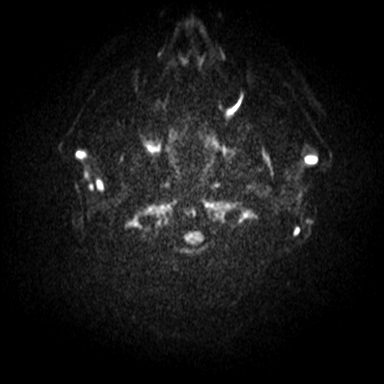
[im 14/55]
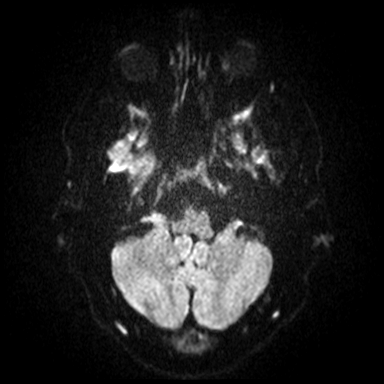
[im 28/55]
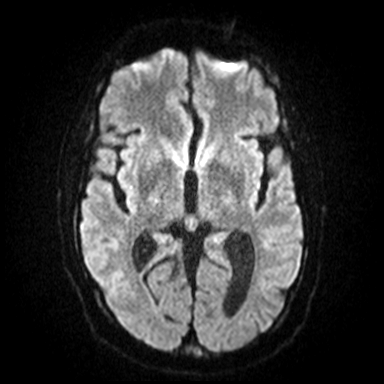
[im 41/55]
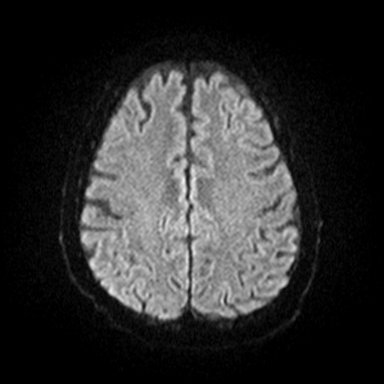
[im 55/55]
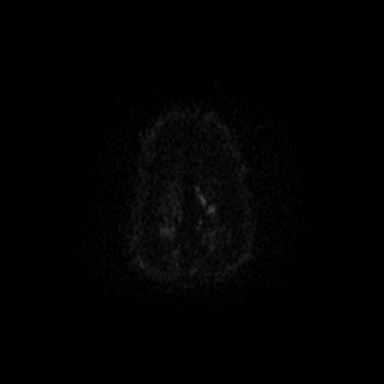

[Series 7: ax dwi_adc · axial · 3.0mm · 0.60mm/px · z∈[-57,+103]mm · 4 of 55 slices shown]
[im 1/55]
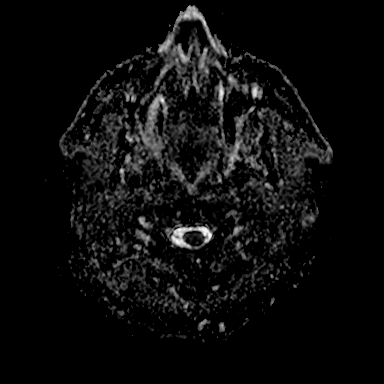
[im 19/55]
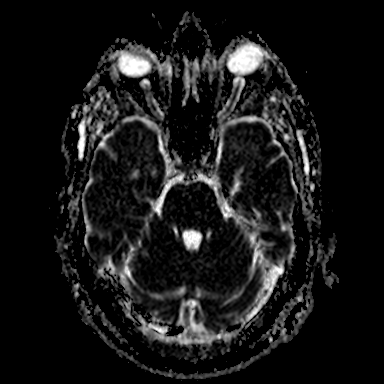
[im 37/55]
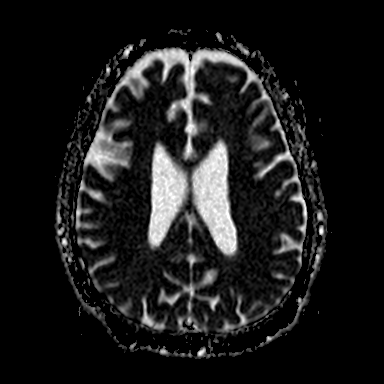
[im 55/55]
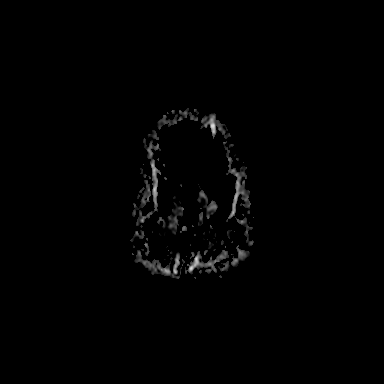

[Series 8: cor dwi_tracew · coronal · 5.0mm · 0.60mm/px · 3 of 45 slices shown]
[im 1/45]
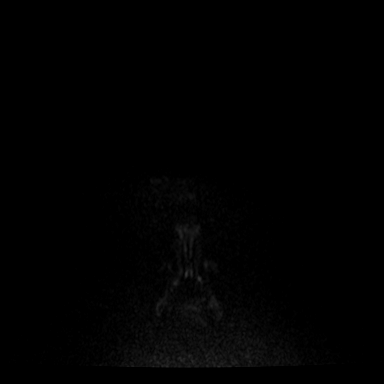
[im 23/45]
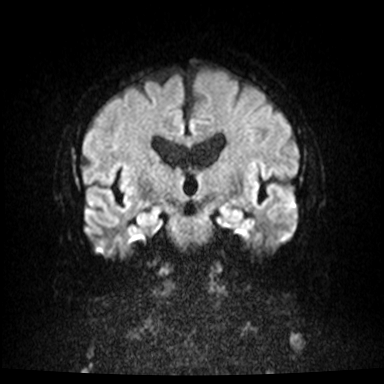
[im 45/45]
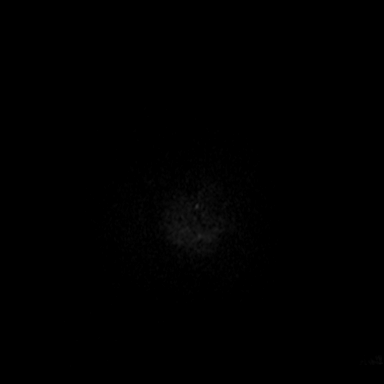

[Series 9: cor dwi_adc · coronal · 5.0mm · 0.60mm/px · 3 of 45 slices shown]
[im 1/45]
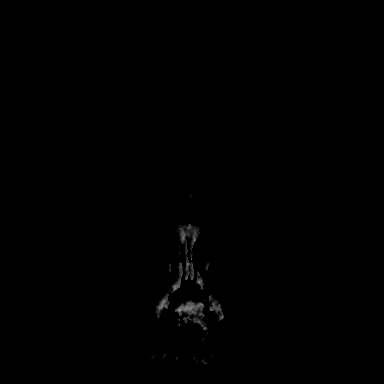
[im 23/45]
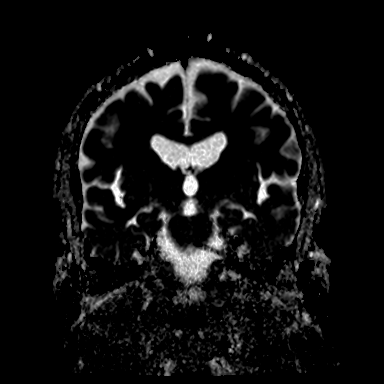
[im 45/45]
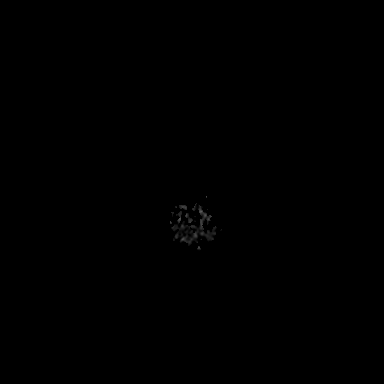

[Series 10: T2 · axial · 5.0mm · 0.53mm/px · z∈[-57,+98]mm · 2 of 27 slices shown (1 of 2)]
[im 1/27]
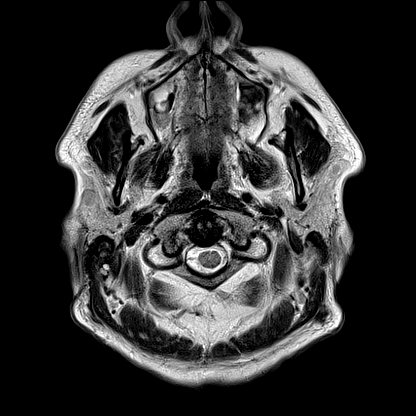
[im 27/27]
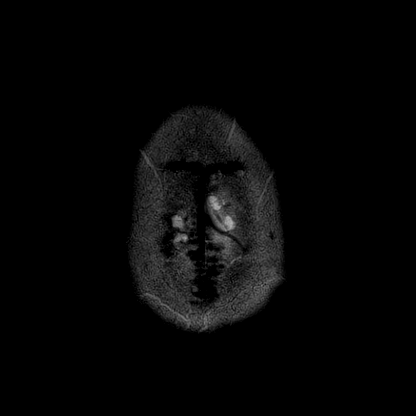

[Series 11: swi_images · axial · 3.0mm · 0.90mm/px · z∈[-66,+109]mm · 5 of 60 slices shown]
[im 1/60]
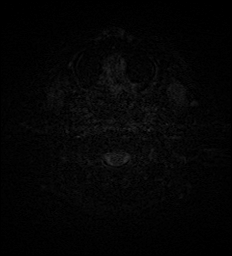
[im 15/60]
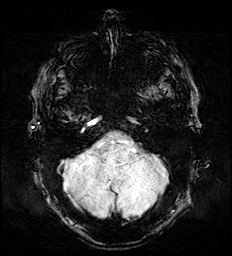
[im 30/60]
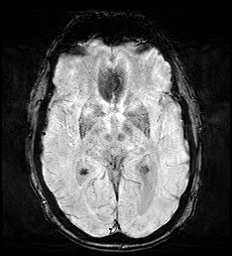
[im 45/60]
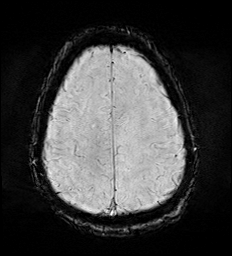
[im 60/60]
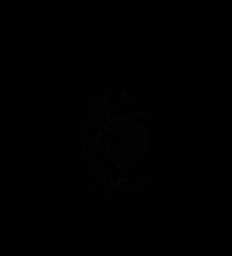

[Series 12: mip_images(sw) · axial · 24.0mm · 0.90mm/px · 1 of 53 slices shown]
[im 1/53]
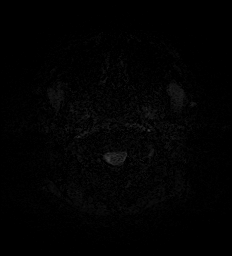

[Series 13: FLAIR · axial · 3.0mm · 0.53mm/px · z∈[-60,+101]mm · 4 of 55 slices shown]
[im 1/55]
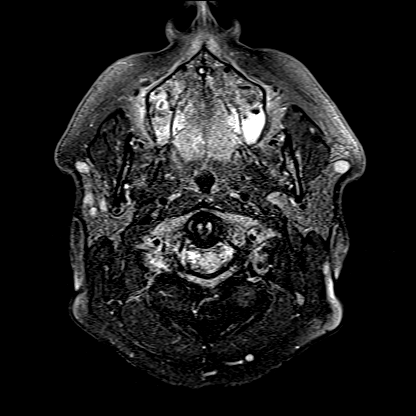
[im 19/55]
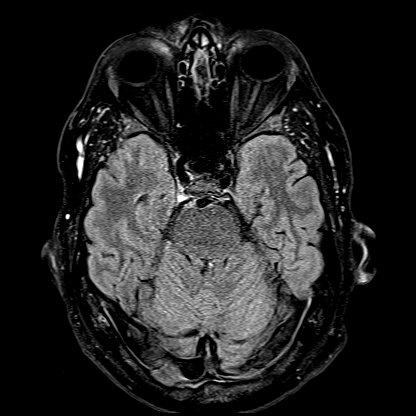
[im 37/55]
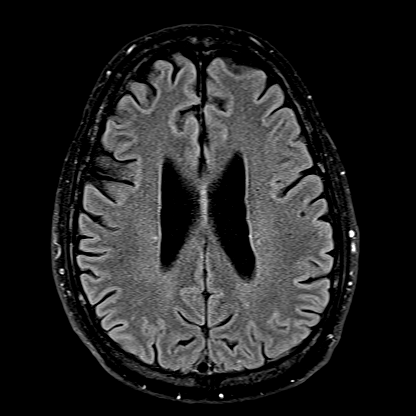
[im 55/55]
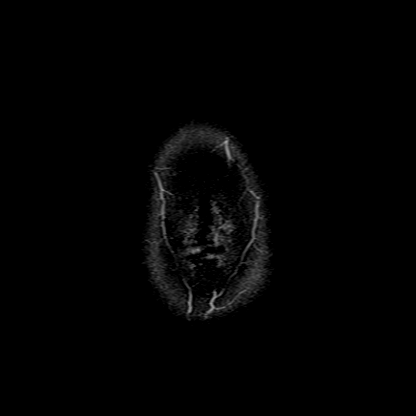

[Series 14: T1 · axial · 1.0mm · 0.98mm/px · z∈[-64,+109]mm · 8 of 176 slices shown (2 of 2)]
[im 1/176]
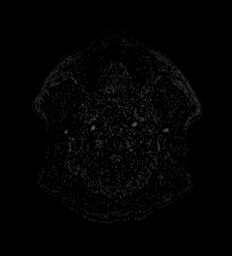
[im 30/176]
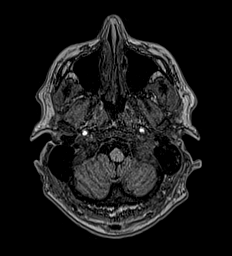
[im 59/176]
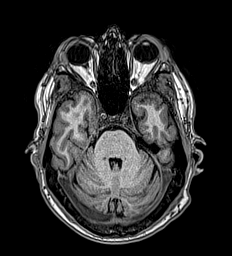
[im 73/176]
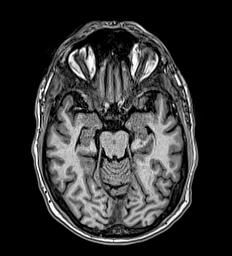
[im 103/176]
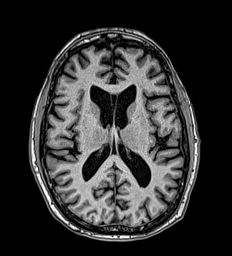
[im 117/176]
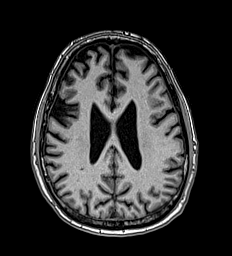
[im 146/176]
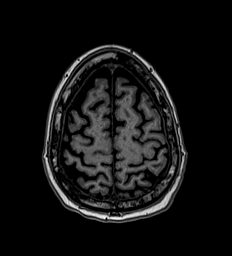
[im 176/176]
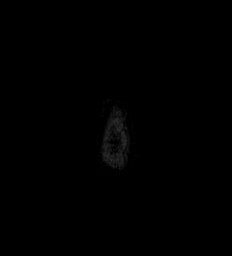

[Series 15: T2 · coronal · 5.0mm · 0.57mm/px · 2 of 31 slices shown (2 of 2)]
[im 1/31]
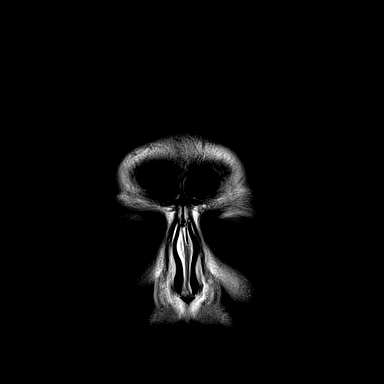
[im 31/31]
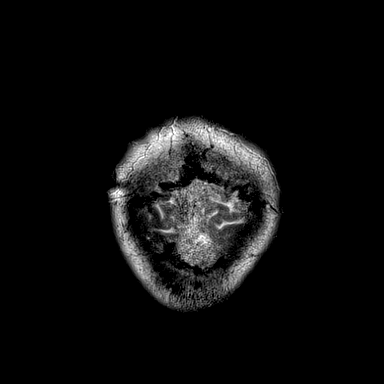

[40 of 48 positions shown; findings below may reference images not displayed]

FINDINGS: Brain: No restricted diffusion to suggest acute infarction. No
midline shift, mass effect, evidence of mass lesion,
ventriculomegaly, extra-axial collection or acute intracranial
hemorrhage. Cervicomedullary junction and pituitary are within
normal limits.

Overall cerebral volume may be diminished beyond that expected for
age, but there are no disproportionate areas of cerebral atrophy
identified. Gray and white matter signal is within normal limits for
age throughout the brain. No cortical encephalomalacia or chronic
cerebral blood products identified. The deep gray matter nuclei,
brainstem, and cerebellum appear normal.

Vascular: Major intracranial vascular flow voids are preserved.
There is generalized intracranial artery tortuosity.

Skull and upper cervical spine: Negative visible cervical spine.
Visualized bone marrow signal is within normal limits.

Sinuses/Orbits: Normal orbits soft tissues. Paranasal Visualized
paranasal sinuses and mastoids are stable and well pneumatized.

Other: Grossly normal visible internal auditory structures. Scalp
and face soft tissues appear negative.
IMPRESSION: 1. No acute intracranial abnormality.
2. Cerebral volume may be decreased beyond that expected for age,
but signal remains normal for age and no areas of disproportionate
cerebral atrophy are identified.
3. Generalized intracranial artery tortuosity.

## 2019-08-21 ENCOUNTER — Telehealth: Payer: Self-pay

## 2019-08-21 NOTE — Telephone Encounter (Signed)
Called patient wife patient was diagnosed with leukemia in May. Made patient a virtual visit on Wednesday

## 2019-08-21 NOTE — Telephone Encounter (Signed)
Patient has been taking the Xifaxan 550mg . Wife states he is not getting the paracentesis anymore. Patient has been taking this every day but wants to know if he needs this anymore. Please advised

## 2019-08-22 ENCOUNTER — Other Ambulatory Visit: Payer: Self-pay

## 2019-08-23 ENCOUNTER — Other Ambulatory Visit: Payer: Self-pay

## 2019-08-23 ENCOUNTER — Telehealth: Payer: Self-pay

## 2019-08-23 ENCOUNTER — Ambulatory Visit (INDEPENDENT_AMBULATORY_CARE_PROVIDER_SITE_OTHER): Payer: BC Managed Care – PPO | Admitting: Gastroenterology

## 2019-08-23 ENCOUNTER — Encounter: Payer: Self-pay | Admitting: Gastroenterology

## 2019-08-23 DIAGNOSIS — K7030 Alcoholic cirrhosis of liver without ascites: Secondary | ICD-10-CM

## 2019-08-23 DIAGNOSIS — Z1211 Encounter for screening for malignant neoplasm of colon: Secondary | ICD-10-CM

## 2019-08-23 DIAGNOSIS — R748 Abnormal levels of other serum enzymes: Secondary | ICD-10-CM

## 2019-08-23 DIAGNOSIS — Z8719 Personal history of other diseases of the digestive system: Secondary | ICD-10-CM

## 2019-08-23 MED ORDER — NA SULFATE-K SULFATE-MG SULF 17.5-3.13-1.6 GM/177ML PO SOLN: 354.0000 mL | Freq: Once | ORAL | 0 refills | Status: AC

## 2019-08-23 NOTE — Telephone Encounter (Signed)
Dr. Bonna Gains wanted me to call patient to scheduled Colonoscopy and EGD. She has also order also a RUQ ultrasound. She lastly order lab work but wants this done at his PCP office at [facility]. Called PCP office and they stated they could do this if we faxed them what we needed drew. Faxed the blood work to [phone] Called patient and left a message for call back

## 2019-08-23 NOTE — Telephone Encounter (Signed)
Colonoscopy for screening Z12.11 and EGD for History of esophageal Varices Z87.19

## 2019-08-23 NOTE — Progress Notes (Signed)
Gabriel Antigua, MD [address]  [place]  [place], [place] [address]  Main: [phone]  Fax: [phone]   Primary Care Physician: Gennaro Africa, MD  Virtual Visit via Video Note  I connected with patient on 08/23/19 at  2:15 PM EST by video (using [email or web address]) and verified that I am speaking with the correct person using two identifiers.   I discussed the limitations, risks, security and privacy concerns of performing an evaluation and management service by video and the availability of in person appointments. I also discussed with the patient that there may be a patient responsible charge related to this service. The patient expressed understanding and agreed to proceed.  Location of Patient: Home Location of Provider: Home Persons involved: Patient and provider only (Nursing staff checked in patient via phone but were not physically involved in the video interaction - see their notes)   History of Present Illness: Chief Complaint  Patient presents with  . Cirrhosis    Patient states he is having no abominal pain or no symptoms     HPI: Gabriel Gray is a 58 y.o. male here for follow-up of cirrhosis due to previous alcohol abuse, now abstinent for a year.  He has not seen [place] in over a year.  Last visit was September 2019.  After that he was supposed to follow-up with [place] in clinic and also have an EGD and colonoscopy.  However, these rescheduled but have not been done and this patient had to cancel them for some reason.  Since his last visit he reports doing well from his cirrhosis aspect.  Was previously requiring paracentesis and has not required this in a long time.  He states this is being monitored by his primary care doctor at [facility].  He was also diagnosed with CLL and is following with hematology oncology at [facility] as well.  Is not on any treatment for it at this time.  He discontinued his diuretics as he is not having any abdominal swelling or lower  extremity swelling.  This was his own decision he did not discuss this with [place].  He has also discontinued his rifaximin.  Previous history: Patient was admitted in the hospital in April 2019, and was in rehab after that. History of alcoholic cirrhosis. Has not had any alcohol since last hospital admission April 2019 as per patient. Drank heavily for 7 to 8 years, daily, and was drinking only on weekends just prior to presentation to the hospital.  He was discharged on Lasix and spironolactone daily that he has been taking. He has been receiving large volume paracentesis through the rehab facility.   April 27, 2018, 3.4 L removed April 07, 2018, 5.3 L removed March 24, 2018, 5.8 L removed March 10, 2018, 7 L removed February 24, 2018, 7 L removed Feb 11, 2018, 5.7 L removed Feb 03, 2018, 6 L removed Jan 20, 2018, 4.5 L removed January 03, 2018, 5 L removed  Fluid studies during the hospital admission were negative for SBP. Cytology did not show any malignant cells. Albumin was less than 1 on peritoneal fluid. Serum albumin is not available from day of paracentesis, but based on albumin done 2 days prior to the paracentesis, SAAGis more than 1.1, and consistent with portal hypertension.   Current Outpatient Medications  Medication Sig Dispense Refill  . amLODipine (NORVASC) 5 MG tablet Take 5 mg by mouth daily.    Marland Kitchen aspirin (ECOTRIN LOW STRENGTH) 81 MG EC tablet  Take 1 tablet by mouth daily.    Marland Kitchen buPROPion (WELLBUTRIN XL) 300 MG 24 hr tablet Take 1 tablet by mouth daily.    Marland Kitchen doxepin (SINEQUAN) 10 MG/ML solution     . HYDROcodone-acetaminophen (NORCO/VICODIN) 5-325 MG tablet Take 1 tablet by mouth every 4 (four) hours as needed.    Marland Kitchen levothyroxine (SYNTHROID, LEVOTHROID) 50 MCG tablet Take 1 tablet by mouth daily.    . Multiple Vitamin (MULTI-VITAMIN PO) Take by mouth.    . naproxen (NAPROSYN) 375 MG tablet Take 1 tablet (375 mg total) by mouth 2 (two) times daily. 20 tablet 0  .  omeprazole (PRILOSEC) 20 MG capsule Take by mouth.    . sildenafil (VIAGRA) 50 MG tablet USE 30 MINUTES BEFORE ACTIVITY *NOT COVERED*    . sucralfate (CARAFATE) 1 g tablet Crush tablet and mix with water; apply to mouth lesion every 6-8 hours    . thiamine (VITAMIN B-1) 100 MG tablet Take 100 mg by mouth daily.    . traMADol (ULTRAM) 50 MG tablet Take 1 tablet (50 mg total) by mouth every 12 (twelve) hours as needed for severe pain. 10 tablet 0  . traZODone (DESYREL) 50 MG tablet TAKE 2 TABLETS (100 MG TOTAL) BY MOUTH NIGHTLY.  5  . XIFAXAN 550 MG TABS tablet TAKE 1 TABLET (550 MG TOTAL) BY MOUTH 2 (TWO) TIMES DAILY. 60 tablet 1  . Na Sulfate-K Sulfate-Mg Sulf 17.5-3.13-1.6 GM/177ML SOLN Take 354 mLs by mouth once for 1 dose. 354 mL 0   No current facility-administered medications for this visit.     Allergies as of 08/23/2019  . (No Known Allergies)    Review of Systems:    All systems reviewed and negative except where noted in HPI.   Observations/Objective:  Labs: CMP     Component Value Date/Time   NA 109 (LL) 01/30/2019 2044   NA 143 06/02/2018 1620   K 4.5 01/30/2019 2044   CL 72 (L) 01/30/2019 2044   CO2 22 01/30/2019 2044   GLUCOSE 156 (H) 01/30/2019 2044   BUN 7 01/30/2019 2044   BUN 15 06/02/2018 1620   CREATININE 0.88 01/30/2019 2044   CALCIUM 7.8 (L) 01/30/2019 2044   PROT 6.4 (L) 01/30/2019 1520   PROT 4.9 (L) 06/02/2018 1620   ALBUMIN 4.4 01/30/2019 1520   ALBUMIN 3.4 (L) 06/02/2018 1620   AST 234 (H) 01/30/2019 1520   ALT 207 (H) 01/30/2019 1520   ALKPHOS 145 (H) 01/30/2019 1520   BILITOT 5.8 (H) 01/30/2019 1520   BILITOT 0.6 06/02/2018 1620   GFRNONAA >60 01/30/2019 2044   GFRAA >60 01/30/2019 2044   Lab Results  Component Value Date   WBC 62.7 (HH) 01/30/2019   HGB 14.9 01/30/2019   HCT 41.4 01/30/2019   MCV 84.0 01/30/2019   PLT 190 01/30/2019    Imaging Studies: No results found.  Assessment and Plan:   Gabriel Gray is a 58 y.o.  y/o male here for follow-up of cirrhosis, over a year after we last saw him, now with new diagnosis of CLL since his last visit  Assessment and Plan: Patient preferred a virtual visit in the setting of Covid and therefore is seeing me virtually today with no a in person clinic visit was recommended since he has not seen me in over a year  His last liver enzymes in August 2020 available in care everywhere were normal.  May 2020 labs showed elevated liver enzymes in the setting of his CLL  diagnosis that was new at that time  He is going for blood work by his PCP this week and we have contacted them to add on the labs that we would also like done at the same time  He is due for right upper quadrant ultrasound and he will get this done  He has self-discontinued his Lasix and spironolactone and is not having any increased abdominal distention or swelling since then  He is seeing neurology for some cerebellar symptoms and has reported experiencing some hallucinations.  I will check an ammonia and if it is not normal, will recommend continuing rifaximin.  He did not do well with lactulose in the past and did not want to continue it due to diarrhea and therefore rifaximin was started previously.  We will also repeat ferritin level as it was elevated in April 2019 during his hospital admission and was attributed to acute phase reaction at the time  Patient willing to schedule EGD for variceal screening and colonoscopy for colorectal cancer screening  Continue alcohol abstinence    Follow Up Instructions: 3-6 months   I discussed the assessment and treatment plan with the patient. The patient was provided an opportunity to ask questions and all were answered. The patient agreed with the plan and demonstrated an understanding of the instructions.   The patient was advised to call back or seek an in-person evaluation if the symptoms worsen or if the condition fails to improve as anticipated.  I  provided 20 minutes of face-to-face time via video software during this encounter. Additional time was spent in reviewing patient's chart, placing orders etc.   Gabriel Manifold, MD  Speech recognition software was used to dictate this note.

## 2019-08-23 NOTE — Patient Instructions (Signed)
Your RUQ ultrasound is scheduled for 09/19/2019 at out patient imaging. There address is [address] [place], [place] [address]. Please arrive at 10:00 for your 10:15 scan. Nothing to eat or drank after midnight. If you need to rescheduled the scan please call [phone] and press option 3 and then 2.

## 2019-08-23 NOTE — Telephone Encounter (Signed)
Called wife and she states she can not take him till the first of the year because of work. Scheduled patient procedure on 09/22/2019 and U/s on 09/19/2019

## 2019-09-19 ENCOUNTER — Ambulatory Visit: Payer: BC Managed Care – PPO

## 2019-09-29 ENCOUNTER — Telehealth: Payer: Self-pay

## 2019-09-29 NOTE — Telephone Encounter (Signed)
Called and left a message for call back to rescheduled patient colonoscopy due to Dr. Bonna Gains being out of the office

## 2019-09-29 NOTE — Telephone Encounter (Signed)
Wife called back and patient would like to do procedure on 10/24/2019. She states that they can not do the ultrasound because [place] requires them to pay 500 dollars up front before they will do the scan and they can not afford this. Informed patient wife that she can tell them that they can not pay that and they will charge it to them. Patient wife states she will rescheduled it at a later time then

## 2019-09-29 NOTE — Telephone Encounter (Signed)
Called and left a message for call back. Sent mychart message  °

## 2019-10-20 ENCOUNTER — Other Ambulatory Visit: Payer: BC Managed Care – PPO

## 2019-10-24 ENCOUNTER — Ambulatory Visit: Admit: 2019-10-24 | Payer: BC Managed Care – PPO | Admitting: Gastroenterology

## 2019-10-24 SURGERY — COLONOSCOPY WITH PROPOFOL
Anesthesia: General

## 2019-12-10 ENCOUNTER — Ambulatory Visit: Payer: BC Managed Care – PPO | Attending: Internal Medicine

## 2019-12-10 DIAGNOSIS — Z23 Encounter for immunization: Secondary | ICD-10-CM

## 2019-12-10 NOTE — Progress Notes (Signed)
   Covid-19 Vaccination Clinic  Name:  Gabriel Gray    MRN: [id number] DOB: 1961/04/02  12/10/2019  [name] was observed post Covid-19 immunization for 15 minutes without incident. He was provided with Vaccine Information Sheet and instruction to access the V-Safe system.   Mr. Many was instructed to call 911 with any severe reactions post vaccine: Marland Kitchen Difficulty breathing  . Swelling of face and throat  . A fast heartbeat  . A bad rash all over body  . Dizziness and weakness   Immunizations Administered    Name Date Dose VIS Date Route   Pfizer COVID-19 Vaccine 12/10/2019  2:19 PM 0.3 mL 08/25/2019 Intramuscular   Manufacturer: [employer]   Lot: [id number]   [place]: [id number]

## 2019-12-20 ENCOUNTER — Inpatient Hospital Stay: Admission: RE | Admit: 2019-12-20 | Payer: BC Managed Care – PPO | Source: Ambulatory Visit

## 2020-01-02 ENCOUNTER — Ambulatory Visit: Payer: BC Managed Care – PPO

## 2020-01-03 ENCOUNTER — Ambulatory Visit: Payer: BC Managed Care – PPO

## 2020-01-09 ENCOUNTER — Ambulatory Visit: Payer: BC Managed Care – PPO | Attending: Internal Medicine

## 2020-01-09 DIAGNOSIS — Z23 Encounter for immunization: Secondary | ICD-10-CM

## 2020-01-09 NOTE — Progress Notes (Signed)
   [id number] Vaccination Clinic  Name:  Gabriel GRIFFETH Sr.    MRN: [id number] DOB: 05-25-1961  01/09/2020  Gabriel Gray was observed post Covid-19 immunization for 15 minutes without incident. He was provided with Vaccine Information Sheet and instruction to access the V-Safe system.   Gabriel Gray was instructed to call 911 with any severe reactions post vaccine: Marland Kitchen Difficulty breathing  . Swelling of face and throat  . A fast heartbeat  . A bad rash all over body  . Dizziness and weakness   Immunizations Administered    Name Date Dose VIS Date Route   Pfizer COVID-19 Vaccine 01/09/2020  1:02 PM 0.3 mL 11/08/2018 Intramuscular   Manufacturer: [place]   Lot: [id number]   [place]: [id number]

## 2020-03-11 ENCOUNTER — Ambulatory Visit (INDEPENDENT_AMBULATORY_CARE_PROVIDER_SITE_OTHER): Payer: BC Managed Care – PPO

## 2020-03-11 ENCOUNTER — Other Ambulatory Visit: Payer: Self-pay

## 2020-03-11 ENCOUNTER — Ambulatory Visit
Admission: EM | Admit: 2020-03-11 | Discharge: 2020-03-11 | Disposition: A | Discharge status: Home / Self Care | Payer: BC Managed Care – PPO | Attending: Family Medicine | Admitting: Family Medicine

## 2020-03-11 DIAGNOSIS — S90221A Contusion of right lesser toe(s) with damage to nail, initial encounter: Secondary | ICD-10-CM | POA: Diagnosis not present

## 2020-03-11 DIAGNOSIS — L03031 Cellulitis of right toe: Secondary | ICD-10-CM | POA: Diagnosis not present

## 2020-03-11 MED ORDER — MUPIROCIN 2 % EX OINT: 1.0000 "application " | TOPICAL_OINTMENT | Freq: Two times a day (BID) | CUTANEOUS | 0 refills | Status: AC

## 2020-03-11 MED ORDER — DOXYCYCLINE HYCLATE 100 MG PO CAPS: 100.0000 mg | ORAL_CAPSULE | Freq: Two times a day (BID) | ORAL | 0 refills | Status: AC

## 2020-03-11 NOTE — ED Triage Notes (Signed)
Pt reports blistering around right 2nd toe nailbed and toenail turning black. "boil" has popped but now skin around very reddened.

## 2020-03-11 NOTE — ED Provider Notes (Signed)
[facility]    CSN: [id number] Arrival date & time: 03/11/20  1518      History   Chief Complaint Chief Complaint  Patient presents with  . Nail Problem   HPI  59 year old male presents with the above complaint.  Patient reports redness around the distal aspect of his right second toe.  Nail is black in coloration.  Patient notes an area of redness just below the interdigital space between the first and second digits.  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  He believes that he may have injured his toe.  No fever.  He has been soaking the area.  He states that this seems to be helping.  Denies pain at this time.  No other associated symptoms.  No other complaints.  Past Medical History:  Diagnosis Date  . Anxiety   . Brain disorder   . Chronic Leukemia   . Cirrhosis ([place])   . Coronary artery disease   . Depression   . Hypertension   . Thyroid disease     Patient Active Problem List   Diagnosis Date Noted  . 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 Repeated falls 06/02/2018  . Imbalance 06/02/2018  . Gynecomastia, male 05/12/2018  . Ascites 01/05/2018  . Hyponatremia 12/31/2017  . 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 Alcoholic cirrhosis of liver without ascites ([place]) 09/18/2017  . Hypothyroidism 09/17/2017  . Alcohol use disorder, moderate, in early remission ([place]) 08/30/2017  . Hypertension, benign 07/02/2009  . Disorder of lipid metabolism 04/29/2006    Home Medications    Prior to Admission medications   Medication Sig Start Date End Date Taking? Authorizing Provider  amLODipine (NORVASC) 5 MG tablet Take 5 mg by mouth daily. 08/07/19   [provider]  aspirin (ECOTRIN LOW STRENGTH) 81 MG EC tablet Take 1 tablet by mouth daily. 12/21/08   [provider]  buPROPion (WELLBUTRIN XL) 300 MG 24 hr tablet Take 1 tablet by mouth daily. 08/25/17   [provider]  doxepin  (SINEQUAN) 10 MG/ML solution  05/05/19   [provider]  doxycycline (VIBRAMYCIN) 100 MG capsule Take 1 capsule (100 mg total) by mouth 2 (two) times daily. 03/11/20   Coral Spikes, DO  HYDROcodone-acetaminophen (NORCO/VICODIN) 5-325 MG tablet Take 1 tablet by mouth every 4 (four) hours as needed. 06/12/19   [provider]  levothyroxine (SYNTHROID, LEVOTHROID) 50 MCG tablet Take 1 tablet by mouth daily. 08/30/17 08/23/19  [provider]  Multiple Vitamin (MULTI-VITAMIN PO) Take by mouth. 02/09/19   [provider]  mupirocin ointment (BACTROBAN) 2 % Apply 1 application topically 2 (two) times daily for 7 days. 03/11/20 03/18/20  Coral Spikes, DO  naproxen (NAPROSYN) 375 MG tablet Take 1 tablet (375 mg total) by mouth 2 (two) times daily. 04/10/19   Wurst, [place], PA-C  omeprazole (PRILOSEC) 20 MG capsule Take by mouth. 04/13/19 04/12/20  [provider]  sildenafil (VIAGRA) 50 MG tablet USE 30 MINUTES BEFORE ACTIVITY *NOT COVERED* 03/29/19   [provider]  sucralfate (CARAFATE) 1 g tablet Crush tablet and mix with water; apply to mouth lesion every 6-8 hours 02/10/19   [provider]  thiamine (VITAMIN B-1) 100 MG tablet Take 100 mg by mouth daily.    [provider]  traMADol (ULTRAM) 50 MG tablet Take 1 tablet (50 mg total) by mouth every 12 (twelve) hours as needed for severe pain. 05/23/19   Wurst,  [place], PA-C  traZODone (DESYREL) 50 MG tablet TAKE 2 TABLETS (100 MG TOTAL) BY MOUTH NIGHTLY. 04/01/18   [provider]  XIFAXAN 550 MG TABS tablet TAKE 1 TABLET (550 MG TOTAL) BY MOUTH 2 (TWO) TIMES DAILY. 11/08/18   Virgel Manifold, MD    Family History Family History  Problem Relation Age of Onset  . Hypertension Mother   . Hypertension Father   . Prostate cancer Father     Social History Social History   Tobacco Use  . Smoking status: Never Smoker  . Smokeless tobacco: Never Used  Vaping Use  . Vaping  Use: Never used  Substance Use Topics  . Alcohol use: Not Currently    Alcohol/week: 2.0 - 3.0 standard drinks    Types: 2 - 3 Glasses of wine per week    Comment: per night  . Drug use: No     Allergies   Patient has no known allergies.   Review of Systems Review of Systems Per HPI  Physical Exam Triage Vital Signs ED Triage Vitals  Enc Vitals Group     BP 03/11/20 1612 (!) 144/99     Pulse Rate 03/11/20 1612 100     Resp 03/11/20 1612 19     Temp 03/11/20 1612 99 F (37.2 C)     Temp Source 03/11/20 1612 Oral     SpO2 03/11/20 1612 98 %     Weight 03/11/20 1611 280 lb (127 kg)     Height 03/11/20 1611 6' (1.829 m)     Head Circumference --      Peak Flow --      Pain Score 03/11/20 1609 0     Pain Loc --      Pain Edu? --      Excl. in [place]? --    Updated Vital Signs BP (!) 144/99 (BP Location: Left Arm)   Pulse 100   Temp 99 F (37.2 C) (Oral)   Resp 19   Ht 6' (1.829 m)   Wt 127 kg   SpO2 98%   BMI 37.97 kg/m   Visual Acuity Right Eye Distance:   Left Eye Distance:   Bilateral Distance:    Right Eye Near:   Left Eye Near:    Bilateral Near:     Physical Exam Constitutional:      General: He is not in acute distress.    Appearance: Normal appearance. He is obese. He is not ill-appearing.  HENT:     Head: Normocephalic and atraumatic.  Eyes:     General:        Right eye: No discharge.        Left eye: No discharge.     Conjunctiva/sclera: Conjunctivae normal.  Pulmonary:     Effort: Pulmonary effort is normal. No respiratory distress.  Musculoskeletal:       Feet:  Feet:     Comments: Raised, erythematous area at the level location. Skin:    Comments: Right second toe with erythema distally and around the nailbed.  Subungual hematoma noted to the nail.  The nail is quite loose and is nearly avulsed.  Neurological:     Mental Status: He is alert.  Psychiatric:        Mood and Affect: Mood normal.        Behavior: Behavior normal.     UC Treatments / Results  Labs (all labs ordered are listed, but only abnormal results are displayed) Labs Reviewed - No  data to display  EKG   Radiology DG Foot Complete Right  Result Date: 03/11/2020 CLINICAL DATA:  Right second toe pain with discoloration EXAM: RIGHT FOOT COMPLETE - 3+ VIEW COMPARISON:  None. FINDINGS: No acute fracture or dislocation is noted. No bony erosive changes to suggest osteomyelitis are seen. No other focal abnormality is noted. IMPRESSION: Soft tissue swelling without acute bony abnormality. Electronically Signed   By: Inez Catalina M.D.   On: 03/11/2020 17:00    Procedures Procedures (including critical care time)  Medications Ordered in UC Medications - No data to display  Initial Impression / Assessment and Plan / UC Course  I have reviewed the triage vital signs and the nursing notes.  Pertinent labs & imaging results that were available during my care of the patient were reviewed by me and considered in my medical decision making (see chart for details).    59 year old male presents with cellulitis of the toe.  He also has a subungual hematoma.  Treating with doxycycline and Bactroban ointment.  Final Clinical Impressions(s) / UC Diagnoses   Final diagnoses:  Cellulitis of toe of right foot  Subungual hematoma of toe of right foot, initial encounter     Discharge Instructions     Medications as prescribed.  Take care  Dr. Lacinda Axon    ED Prescriptions    Medication Sig Dispense Auth. Provider   doxycycline (VIBRAMYCIN) 100 MG capsule Take 1 capsule (100 mg total) by mouth 2 (two) times daily. 14 capsule Malina Geers G, DO   mupirocin ointment (BACTROBAN) 2 % Apply 1 application topically 2 (two) times daily for 7 days. 22 g Coral Spikes, DO     PDMP not reviewed this encounter.   Coral Spikes, [place] 03/11/20 1842

## 2020-03-11 NOTE — Discharge Instructions (Signed)
Medications as prescribed. ° °Take care ° °Dr. Jacquelene Kopecky  °

## 2020-12-09 ENCOUNTER — Telehealth (HOSPITAL_COMMUNITY): Payer: Self-pay | Admitting: Psychiatry
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
## 2020-12-10 ENCOUNTER — Telehealth (HOSPITAL_COMMUNITY): Payer: Self-pay | Admitting: Licensed Clinical Social Worker

## 2020-12-16 ENCOUNTER — Emergency Department: Payer: BC Managed Care – PPO

## 2020-12-16 ENCOUNTER — Emergency Department
Admission: EM | Admit: 2020-12-16 | Discharge: 2020-12-16 | Disposition: A | Discharge status: Home / Self Care | Payer: BC Managed Care – PPO | Attending: Emergency Medicine | Admitting: Emergency Medicine

## 2020-12-16 ENCOUNTER — Other Ambulatory Visit: Payer: Self-pay

## 2020-12-16 DIAGNOSIS — W19XXXA Unspecified fall, initial encounter: Secondary | ICD-10-CM | POA: Diagnosis not present

## 2020-12-16 DIAGNOSIS — M79662 Pain in left lower leg: Secondary | ICD-10-CM | POA: Diagnosis present

## 2020-12-16 DIAGNOSIS — I10 Essential (primary) hypertension: Secondary | ICD-10-CM | POA: Insufficient documentation

## 2020-12-16 DIAGNOSIS — M7122 Synovial cyst of popliteal space [Baker], left knee: Secondary | ICD-10-CM | POA: Diagnosis not present

## 2020-12-16 DIAGNOSIS — E039 Hypothyroidism, unspecified: Secondary | ICD-10-CM | POA: Insufficient documentation

## 2020-12-16 DIAGNOSIS — Z7982 Long term (current) use of aspirin: Secondary | ICD-10-CM | POA: Insufficient documentation

## 2020-12-16 DIAGNOSIS — I2510 Atherosclerotic heart disease of native coronary artery without angina pectoris: Secondary | ICD-10-CM | POA: Diagnosis not present

## 2020-12-16 DIAGNOSIS — Z79899 Other long term (current) drug therapy: Secondary | ICD-10-CM | POA: Diagnosis not present

## 2020-12-16 MED ORDER — OXYCODONE-ACETAMINOPHEN 7.5-325 MG PO TABS: 1.0000 | ORAL_TABLET | Freq: Four times a day (QID) | ORAL | 0 refills | Status: AC | PRN

## 2020-12-16 MED ORDER — HYDROMORPHONE HCL 1 MG/ML IJ SOLN
1.0000 mg | Freq: Once | INTRAMUSCULAR | Status: AC
  Administered 2020-12-16: 1 mg via INTRAMUSCULAR
  Filled 2020-12-16: qty 1

## 2020-12-16 NOTE — Discharge Instructions (Addendum)
Read and follow discharge care instruction.  Call orthopedic doctor clinic listed in your discharge care instructions after 2:00 this afternoon.  Tell them you are a follow-up from the emergency room.  Advised pain medication may cause drowsiness.

## 2020-12-16 NOTE — ED Provider Notes (Signed)
[facility] Emergency Department Provider Note   ____________________________________________   Event Date/Time   First MD Initiated Contact with Patient 12/16/20 0932     (approximate)  I have reviewed the triage vital signs and the nursing notes.   HISTORY  Chief Complaint Leg Pain    HPI Gabriel KLEINERT Sr. is a 60 y.o. male patient complains of pain and swelling to the left lower leg.  Patient state he had a mechanical fall about a week ago.  Patient states well-developed 48 hours ago.  No redness noted.  Patient denies chest pain or shortness of breath.  Patient was concerned for DVT due to multiple varicose veins.  Rates his pain as a 1/10.  Described pain as "sore".  Patient has elevated the leg and walk with support of a cane.         Past Medical History:  Diagnosis Date  . Anxiety   . Brain disorder   . Chronic Leukemia   . Cirrhosis ([place])   . Coronary artery disease   . Depression   . Hypertension   . Thyroid disease     Patient Active Problem List   Diagnosis Date Noted  . Chronic lymphocytic leukemia (CLL), B-cell ([place]) 02/14/2019  . Repeated falls 06/02/2018  . Imbalance 06/02/2018  . Gynecomastia, male 05/12/2018  . Ascites 01/05/2018  . Hyponatremia 12/31/2017  . 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 Alcoholic cirrhosis of liver without ascites ([place]) 09/18/2017  . Hypothyroidism 09/17/2017  . Alcohol use disorder, moderate, in early remission ([place]) 08/30/2017  . Hypertension, benign 07/02/2009  . Disorder of lipid metabolism 04/29/2006    History reviewed. No pertinent surgical history.  Prior to Admission medications   Medication Sig Start Date End Date Taking? Authorizing Provider  oxyCODONE-acetaminophen (PERCOCET) 7.5-325 MG tablet Take 1 tablet by mouth every 6 (six) hours as needed for up to 3 days for severe pain. 12/16/20 12/19/20 Yes Sable Feil, PA-C  amLODipine (NORVASC) 5 MG tablet Take 5 mg by  mouth daily. 08/07/19   [provider]  aspirin (ECOTRIN LOW STRENGTH) 81 MG EC tablet Take 1 tablet by mouth daily. 12/21/08   [provider]  buPROPion (WELLBUTRIN XL) 300 MG 24 hr tablet Take 1 tablet by mouth daily. 08/25/17   [provider]  doxepin (SINEQUAN) 10 MG/ML solution  05/05/19   [provider]  doxycycline (VIBRAMYCIN) 100 MG capsule Take 1 capsule (100 mg total) by mouth 2 (two) times daily. 03/11/20   Coral Spikes, DO  HYDROcodone-acetaminophen (NORCO/VICODIN) 5-325 MG tablet Take 1 tablet by mouth every 4 (four) hours as needed. 06/12/19   [provider]  levothyroxine (SYNTHROID, LEVOTHROID) 50 MCG tablet Take 1 tablet by mouth daily. 08/30/17 08/23/19  [provider]  Multiple Vitamin (MULTI-VITAMIN PO) Take by mouth. 02/09/19   [provider]  naproxen (NAPROSYN) 375 MG tablet Take 1 tablet (375 mg total) by mouth 2 (two) times daily. 04/10/19   Wurst, [place], PA-C  omeprazole (PRILOSEC) 20 MG capsule Take by mouth. 04/13/19 04/12/20  [provider]  sildenafil (VIAGRA) 50 MG tablet USE 30 MINUTES BEFORE ACTIVITY *NOT COVERED* 03/29/19   [provider]  sucralfate (CARAFATE) 1 g tablet Crush tablet and mix with water; apply to mouth lesion every 6-8 hours 02/10/19   [provider]  thiamine (VITAMIN B-1) 100 MG tablet Take 100 mg by mouth daily.    [provider]  traMADol (ULTRAM) 50 MG  tablet Take 1 tablet (50 mg total) by mouth every 12 (twelve) hours as needed for severe pain. 05/23/19   Wurst, [place], PA-C  traZODone (DESYREL) 50 MG tablet TAKE 2 TABLETS (100 MG TOTAL) BY MOUTH NIGHTLY. 04/01/18   [provider]  XIFAXAN 550 MG TABS tablet TAKE 1 TABLET (550 MG TOTAL) BY MOUTH 2 (TWO) TIMES DAILY. 11/08/18   Virgel Manifold, MD    Allergies Patient has no known allergies.  Family History  Problem Relation Age of Onset  . Hypertension Mother   .  Hypertension Father   . Prostate cancer Father     Social History Social History   Tobacco Use  . Smoking status: Never Smoker  . Smokeless tobacco: Never Used  Vaping Use  . Vaping Use: Never used  Substance Use Topics  . Alcohol use: Not Currently    Alcohol/week: 2.0 - 3.0 standard drinks    Types: 2 - 3 Glasses of wine per week    Comment: per night  . Drug use: No    Review of Systems Constitutional: No fever/chills Eyes: No visual changes. ENT: No sore throat. Cardiovascular: Denies chest pain. Respiratory: Denies shortness of breath. Gastrointestinal: No abdominal pain.  No nausea, no vomiting.  No diarrhea.  No constipation. Genitourinary: Negative for dysuria. Musculoskeletal: Left leg pain and edema. Skin: Negative for rash. Neurological: Negative for headaches, focal weakness or numbness. Psychiatric:  Anxiety and depression Endocrine:  Hypertension and hypothyroidism   ____________________________________________   PHYSICAL EXAM:  VITAL SIGNS: ED Triage Vitals  Enc Vitals Group     BP 12/16/20 0853 (!) 155/88     Pulse Rate 12/16/20 0853 98     Resp 12/16/20 0853 19     Temp 12/16/20 0853 99.2 F (37.3 C)     Temp Source 12/16/20 0853 Oral     SpO2 12/16/20 0853 97 %     Weight 12/16/20 0852 275 lb (124.7 kg)     Height 12/16/20 0852 6' (1.829 m)     Head Circumference --      Peak Flow --      Pain Score 12/16/20 0852 1     Pain Loc --      Pain Edu? --      Excl. in [place]? --     Constitutional: Alert and oriented. Well appearing and in no acute distress. Cardiovascular: Normal rate, regular rhythm. Grossly normal heart sounds.  Good peripheral circulation.  Elevated blood pressure. Respiratory: Normal respiratory effort.  No retractions. Lungs CTAB. Musculoskeletal: Left lower extremity  edema.  No joint effusions.  Moderate guarding palpation of the left popliteal area. Neurologic:  Normal speech and language. No gross focal neurologic  deficits are appreciated. No gait instability. Skin:  Skin is warm, dry and intact. No rash noted. Psychiatric: Mood and affect are normal. Speech and behavior are normal.  ____________________________________________   LABS (all labs ordered are listed, but only abnormal results are displayed)  Labs Reviewed - No data to display ____________________________________________  EKG   ____________________________________________  RADIOLOGY I, Sable Feil, personally viewed and evaluated these images (plain radiographs) as part of my medical decision making, as well as reviewing the written report by the radiologist.  ED MD interpretation:   Official radiology report(s): US Venous Img Lower Unilateral Left  Result Date: 12/16/2020 CLINICAL DATA:  60 year old male with left lower extremity pain and edema for 1 week EXAM: LEFT LOWER EXTREMITY VENOUS DOPPLER ULTRASOUND TECHNIQUE: Gray-scale sonography with compression,  as well as color and duplex ultrasound, were performed to evaluate the deep venous system(s) from the level of the common femoral vein through the popliteal and proximal calf veins. COMPARISON:  None. FINDINGS: VENOUS Normal compressibility of the common femoral, superficial femoral, and popliteal veins, as well as the visualized calf veins. Visualized portions of profunda femoral vein and great saphenous vein unremarkable. No filling defects to suggest DVT on grayscale or color Doppler imaging. Doppler waveforms show normal direction of venous flow, normal respiratory plasticity and response to augmentation. Limited views of the contralateral common femoral vein are unremarkable. OTHER Complex fluid collection in the medial popliteal fossa measures approximately 5.4 x 1.3 x 2.3 cm. No evidence of internal vascularity on color Doppler imaging. Limitations: none IMPRESSION: 1. No evidence of deeper superficial venous thrombosis in the left lower extremity. 2. Complex Baker's cyst in  the popliteal fossa. Electronically Signed   By: Jacqulynn Cadet M.D.   On: 12/16/2020 11:15    ____________________________________________   PROCEDURES  Procedure(s) performed (including Critical Care):  Procedures   ____________________________________________   INITIAL IMPRESSION / ASSESSMENT AND PLAN / ED COURSE  As part of my medical decision making, I reviewed the following data within the [place]         Patient presents with left lower leg pain and edema.  Discussed ultrasounds results which are negative for DVT which was the patient's primary concern.  Patient did had a complex Baker's cyst.  Discussed patient with on-call orthopedic doctor who will follow up with the patient in his office.  Patient given discharge care instructions and advised to follow-up with orthopedics.  Return to ED if condition worsens.      ____________________________________________   FINAL CLINICAL IMPRESSION(S) / ED DIAGNOSES  Final diagnoses:  Baker's cyst of knee, left     ED Discharge Orders         Ordered    oxyCODONE-acetaminophen (PERCOCET) 7.5-325 MG tablet  Every 6 hours PRN        12/16/20 1208          *Please note:  Gabriel Guthrie Sr. was evaluated in Emergency Department on 12/17/2020 for the symptoms described in the history of present illness. He was evaluated in the context of the global COVID-19 pandemic, which necessitated consideration that the patient might be at risk for infection with the SARS-CoV-2 virus that causes COVID-19. Institutional protocols and algorithms that pertain to the evaluation of patients at risk for COVID-19 are in a state of rapid change based on information released by regulatory bodies including the CDC and federal and state organizations. These policies and algorithms were followed during the patient's care in the ED.  Some ED evaluations and interventions may be delayed as a result of limited staffing during and the  pandemic.*   Note:  This document was prepared using Dragon voice recognition software and may include unintentional dictation errors.    Sable Feil, PA-C 12/17/20 [id number]    Blake Divine, MD 12/17/20 [phone]

## 2020-12-16 NOTE — ED Triage Notes (Signed)
Pt comes with c/o left leg pain following a mechanical fall a few days ago. Pt states since he has had left calf pain and thinks he might have a blood clot.

## 2020-12-16 NOTE — ED Notes (Signed)
See triage note  Presents with pain and swelling to left lower leg  States he had a fall about 1 week ago  Developed swelling 48 hr ago  No redness noted

## 2020-12-17 ENCOUNTER — Other Ambulatory Visit (HOSPITAL_COMMUNITY): Payer: BC Managed Care – PPO | Attending: Psychiatry | Admitting: Licensed Clinical Social Worker

## 2020-12-17 DIAGNOSIS — F322 Major depressive disorder, single episode, severe without psychotic features: Secondary | ICD-10-CM

## 2020-12-23 ENCOUNTER — Other Ambulatory Visit (HOSPITAL_COMMUNITY): Payer: BC Managed Care – PPO

## 2020-12-23 ENCOUNTER — Other Ambulatory Visit: Payer: Self-pay

## 2020-12-24 ENCOUNTER — Other Ambulatory Visit (HOSPITAL_COMMUNITY): Payer: BC Managed Care – PPO

## 2020-12-24 ENCOUNTER — Telehealth (HOSPITAL_COMMUNITY): Payer: Self-pay | Admitting: Licensed Clinical Social Worker

## 2020-12-24 ENCOUNTER — Other Ambulatory Visit: Payer: Self-pay

## 2020-12-25 ENCOUNTER — Other Ambulatory Visit (HOSPITAL_COMMUNITY): Payer: BC Managed Care – PPO

## 2020-12-25 ENCOUNTER — Other Ambulatory Visit: Payer: Self-pay

## 2020-12-26 ENCOUNTER — Ambulatory Visit (HOSPITAL_COMMUNITY): Payer: Self-pay

## 2020-12-26 ENCOUNTER — Other Ambulatory Visit (HOSPITAL_COMMUNITY): Payer: Self-pay

## 2020-12-27 ENCOUNTER — Ambulatory Visit (HOSPITAL_COMMUNITY): Payer: Self-pay

## 2020-12-27 ENCOUNTER — Other Ambulatory Visit (HOSPITAL_COMMUNITY): Payer: Self-pay

## 2020-12-30 ENCOUNTER — Ambulatory Visit (HOSPITAL_COMMUNITY): Payer: Self-pay

## 2020-12-30 ENCOUNTER — Other Ambulatory Visit (HOSPITAL_COMMUNITY): Payer: Self-pay

## 2020-12-31 ENCOUNTER — Ambulatory Visit (HOSPITAL_COMMUNITY): Payer: Self-pay

## 2020-12-31 ENCOUNTER — Other Ambulatory Visit (HOSPITAL_COMMUNITY): Payer: Self-pay

## 2021-01-01 ENCOUNTER — Ambulatory Visit (HOSPITAL_COMMUNITY): Payer: Self-pay

## 2021-01-01 ENCOUNTER — Other Ambulatory Visit (HOSPITAL_COMMUNITY): Payer: Self-pay

## 2021-01-02 ENCOUNTER — Other Ambulatory Visit (HOSPITAL_COMMUNITY): Payer: Self-pay

## 2021-01-03 ENCOUNTER — Ambulatory Visit (HOSPITAL_COMMUNITY): Payer: Self-pay

## 2021-01-03 ENCOUNTER — Other Ambulatory Visit (HOSPITAL_COMMUNITY): Payer: Self-pay

## 2021-01-06 ENCOUNTER — Other Ambulatory Visit (HOSPITAL_COMMUNITY): Payer: Self-pay

## 2021-01-06 ENCOUNTER — Ambulatory Visit (HOSPITAL_COMMUNITY): Payer: Self-pay

## 2021-01-07 ENCOUNTER — Ambulatory Visit (HOSPITAL_COMMUNITY): Payer: Self-pay

## 2021-01-07 ENCOUNTER — Other Ambulatory Visit (HOSPITAL_COMMUNITY): Payer: Self-pay

## 2021-01-08 ENCOUNTER — Other Ambulatory Visit (HOSPITAL_COMMUNITY): Payer: Self-pay

## 2021-01-08 ENCOUNTER — Ambulatory Visit (HOSPITAL_COMMUNITY): Payer: Self-pay

## 2021-01-09 ENCOUNTER — Other Ambulatory Visit (HOSPITAL_COMMUNITY): Payer: Self-pay

## 2021-01-09 ENCOUNTER — Ambulatory Visit (HOSPITAL_COMMUNITY): Payer: Self-pay

## 2021-01-10 ENCOUNTER — Other Ambulatory Visit (HOSPITAL_COMMUNITY): Payer: Self-pay

## 2021-01-10 ENCOUNTER — Ambulatory Visit (HOSPITAL_COMMUNITY): Payer: Self-pay

## 2021-01-13 NOTE — Psych (Signed)
Virtual Visit via Video Note  I connected with Gabriel Guthrie Sr. on 12/17/20 at  2:00 PM EDT by a video enabled telemedicine application and verified that I am speaking with the correct person using two identifiers.  Location: Patient: patient home Provider: clinical home office   I discussed the limitations of evaluation and management by telemedicine and the availability of in person appointments. The patient expressed understanding and agreed to proceed.  History of Present Illness: Pt presents as self-referral for PHP. Pt states "I'm fucked up right now" and shares depression symptoms along with anger. Pt states he has been trying to see a psychiatrist for almost a year and the appointments keep getting canceled and pushed out. Pt states he has been doing research on his own to try to figure out what is wrong because he has not been able to see a professional. Pt reports suspicion he may have Dissociative Identity Disorder, severe dissociation, or narcolepsy. Pt states he is losing time. Pt gives example of dropping something off the kitchen counter and going to the closet to get a rag to clean it, and when he comes back nothing is on the floor and his wife tells him that happened last week. Pt states he has had a sleep study at the end of 2021 however has had to wait for an upcoming appt in May to hear the results. Pt identifies treatment as early as 2004 by a psychiatrist/therapist. Pt reports medical issues including chronic leukemia, cerebellum ataxia, and cirrhosis. Pt denies SI/HI and AVH.   Observations/Objective: Pt is highly tangential and difficult to redirect. CCA is not able to be completed due to pt's lack of focus. Pt is pleasant and conversational with casual appearance.   Assessment and Plan: Pt reports desire to engage in [facility] and requests start date for next week due to surgery at the end of this week.  Follow Up Instructions: Pt will complete intake paperwork and tentatively  begin PHP on 4/13 unless still in recovery from surgery. Pt requests all decisions be cosigned by his wife stating he is not cognitively stable enough to remember details. Pt directs that if he and wife's report differ to follow his wife's statements. Cln will send ROI for pt to complete to allow wife open access to his records and treatment.    I discussed the assessment and treatment plan with the patient. The patient was provided an opportunity to ask questions and all were answered. The patient agreed with the plan and demonstrated an understanding of the instructions.   The patient was advised to call back or seek an in-person evaluation if the symptoms worsen or if the condition fails to improve as anticipated.  I provided 80 minutes of non-face-to-face time during this encounter.   Lorin Glass, LCSW
